# Patient Record
Sex: Female | Born: 1990
Health system: Southern US, Community
[De-identification: ages and names within clinical notes are randomized; demographics above are authoritative.]

## PROBLEM LIST (undated history)

## (undated) DIAGNOSIS — F419 Anxiety disorder, unspecified: Secondary | ICD-10-CM

## (undated) HISTORY — PX: WISDOM TOOTH EXTRACTION: SHX21

## (undated) HISTORY — PX: FRACTURE SURGERY: SHX138

## (undated) HISTORY — PX: TONSILLECTOMY: SUR1361

## (undated) HISTORY — PX: APPENDECTOMY: SHX54

## (undated) HISTORY — PX: ECTOPIC PREGNANCY SURGERY: SHX613

---
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
## 2010-04-15 ENCOUNTER — Ambulatory Visit: Payer: Self-pay | Admitting: Family Medicine

## 2010-04-15 ENCOUNTER — Inpatient Hospital Stay (HOSPITAL_COMMUNITY): Admission: AD | Admit: 2010-04-15 | Discharge: 2010-04-15 | Payer: Self-pay | Admitting: Family Medicine

## 2010-04-15 ENCOUNTER — Encounter: Payer: Self-pay | Admitting: Emergency Medicine

## 2010-04-24 ENCOUNTER — Ambulatory Visit (HOSPITAL_COMMUNITY): Admission: AD | Admit: 2010-04-24 | Discharge: 2010-04-24 | Payer: Self-pay | Admitting: Obstetrics and Gynecology

## 2010-09-18 LAB — CROSSMATCH
ABO/RH(D): A POS
Unit division: 0

## 2010-09-18 LAB — CBC
HCT: 35.4 % — ABNORMAL LOW (ref 36.0–46.0)
RBC: 4.11 MIL/uL (ref 3.87–5.11)
RDW: 12.8 % (ref 11.5–15.5)
WBC: 11.8 10*3/uL — ABNORMAL HIGH (ref 4.0–10.5)

## 2010-09-18 LAB — COMPREHENSIVE METABOLIC PANEL
AST: 18 U/L (ref 0–37)
Albumin: 3.6 g/dL (ref 3.5–5.2)
Alkaline Phosphatase: 60 U/L (ref 39–117)
BUN: 7 mg/dL (ref 6–23)
CO2: 23 mEq/L (ref 19–32)
Chloride: 109 mEq/L (ref 96–112)
GFR calc non Af Amer: >60 mL/min (ref >60)
Potassium: 3.5 mEq/L (ref 3.5–5.1)
Total Bilirubin: 0.4 mg/dL (ref 0.3–1.2)

## 2010-09-18 LAB — ABO/RH: ABO/RH(D): A POS

## 2010-09-19 LAB — CBC
MCH: 29 pg (ref 26.0–34.0)
MCV: 84.9 fL (ref 78.0–100.0)
Platelets: 234 10*3/uL (ref 150–400)
RDW: 12.8 % (ref 11.5–15.5)

## 2010-09-19 LAB — DIFFERENTIAL
Basophils Absolute: 0.1 10*3/uL (ref 0.0–0.1)
Eosinophils Absolute: 0 10*3/uL (ref 0.0–0.7)
Eosinophils Relative: 0 % (ref 0–5)
Lymphs Abs: 1.7 10*3/uL (ref 0.7–4.0)

## 2010-09-19 LAB — COMPREHENSIVE METABOLIC PANEL
Alkaline Phosphatase: 63 U/L (ref 39–117)
BUN: 6 mg/dL (ref 6–23)
GFR calc non Af Amer: >60 mL/min (ref >60)
Glucose, Bld: 95 mg/dL (ref 70–99)
Potassium: 3.8 mEq/L (ref 3.5–5.1)
Total Protein: 7 g/dL (ref 6.0–8.3)

## 2010-09-19 LAB — WET PREP, GENITAL: Clue Cells Wet Prep HPF POC: NONE SEEN

## 2010-09-19 LAB — URINALYSIS, ROUTINE W REFLEX MICROSCOPIC
Leukocytes, UA: NEGATIVE
Nitrite: NEGATIVE
Specific Gravity, Urine: 1.025 (ref 1.005–1.030)
Urobilinogen, UA: 0.2 mg/dL (ref 0.0–1.0)

## 2010-09-19 LAB — URINE MICROSCOPIC-ADD ON

## 2010-09-19 LAB — GC/CHLAMYDIA PROBE AMP, GENITAL
Chlamydia, DNA Probe: NEGATIVE
GC Probe Amp, Genital: NEGATIVE

## 2010-09-19 LAB — PREGNANCY, URINE: Preg Test, Ur: POSITIVE

## 2010-09-19 LAB — HCG, QUANTITATIVE, PREGNANCY: hCG, Beta Chain, Quant, S: 3886 m[IU]/mL — ABNORMAL HIGH (ref <5)

## 2010-11-19 NOTE — Op Note (Signed)
NAME:  Telleria, Caterin                ACCOUNT NO.:  [id number]   MEDICAL RECORD NO.:  [id number]          PATIENT TYPE:  OBV   LOCATION:  6119                         FACILITY:  [facility]   PHYSICIAN:  Antony Contras, MD     DATE OF BIRTH:  [birth year]   DATE OF PROCEDURE:  05/20/2007  DATE OF DISCHARGE:                               OPERATIVE REPORT   PREOPERATIVE DIAGNOSIS:  Left peritonsillar abscess.   POSTOPERATIVE DIAGNOSIS:  Left peritonsillar abscess.   PROCEDURE:  Incision and drainage of left peritonsillar abscess.   SURGEON:  Antony Contras, M.D.   ANESTHESIA:  Local.   COMPLICATIONS:  None.   INDICATIONS:  The patient is a 20 year old [place] female who has had left  sided throat pain for about five days that has been worsening.  A CT  scan has demonstrated a left peritonsillar abscess.   DESCRIPTION OF PROCEDURE:  The patient is identified in the hospital  room.  Informed consent was obtained from the family including a  discussion of risks, benefits, and alternatives.  The patient was placed  in a sitting position and the left oropharynx was sprayed with  Hurricaine spray on two occasions.  The left oropharynx was then  injected with 1% lidocaine with 1:100,000 epinephrine.  A horizontal  incision was made with an 11 blade scalpel above the left tonsil.  The  deep tissues were then dissected bluntly using a curved hemostat until  the abscess was entered and yellow pus drained.  The opening was widened  somewhat with a hemostat.  The patient tolerated the procedure well  without complications.      Antony Contras, MD  Electronically Signed     DDB/MEDQ  D:  05/20/2007  T:  05/20/2007  Job:  [id number]

## 2010-11-19 NOTE — H&P (Signed)
NAME:  [name] [name], [name] [name]                ACCOUNT NO.:  [id number]   MEDICAL RECORD NO.:  [id number]          PATIENT TYPE:  OBV   LOCATION:  6119                         FACILITY:  [facility]   PHYSICIAN:  Antony Contras, MD     DATE OF BIRTH:  11-11-1990   DATE OF ADMISSION:  05/19/2007  DATE OF DISCHARGE:  05/20/2007                              HISTORY & PHYSICAL   CHIEF COMPLAINT:  Throat pain.   HISTORY OF THE PRESENT ILLNESS:  The patient is a 20 year old [name] [name]  female who developed a sore throat on the left side about 5 days ago.  She went to her doctor where Strept and mono testing were negative.  She  was prescribed a Z-Pak.  The pain worsened and she returned her doctor  where her antibiotic was changed to Augmentin.  She was also given a  steroid shot and pain medication.  The pain continued worsen through the  day yesterday and she came to the emergency department as a result.  She  is having difficulty swallowing due to pain.  She was diagnosed with a  left peritonsillar abscess and is admitted into the hospital.  Last  night her pain was 9/10 and today it is 2/10.  The pain radiates into  the left ear.  She has no other complaints.   PAST MEDICAL HISTORY:  None.   PAST SURGICAL HISTORY:  Appendectomy and two ankle surgeries   MEDICATIONS:  YAZ, Celexa and Augmentin.   ALLERGIES:  No known drug allergies.   FAMILY HISTORY:  The family history is positive for heart disease in her  grandparents.   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  She  admits to drinking and smoking cigarettes socially.   REVIEW OF SYSTEMS:  The review of systems is negative, except as listed  above.   PHYSICAL EXAMINATION:  VITAL SIGNS:  On physical exam she is afebrile nd  the vital signs are stable.  GENERAL APPEARANCE:  In general the patient is in no acute distress.  She is pleasant and cooperative.  She appears slightly uncomfortable;  and, is accompanied by her  parents.  EARS:  The external ears are normal.  External canals are patent.  Tympanic membranes are intact.  Middle ear spaces are aerated.  NOSE:  The external nose is normal.  Nasal passages are patent.  Septum  is relatively midline.  MOUTH:  The lips, teeth and gums are normal.  The patient has slight  trismus.  The tongue and the floor of the mouth are normal.  The left  tonsil is inflamed with exudate and with some fullness at the left  peritonsillar region.  The uvula is relatively midline.  The right  tonsil is normal in appearance.  HEAD AND FACE:  The head and face reveal there are no abnormalities.  NECK:  In the neck the patient has some tenderness in the left zone two  region; otherwise, the neck is without tenderness and without mass.  LYMPHATICS:  The lymphatics show there is no cervical adenopathy.  NEUROLOGIC:  The neurological exam show cranial nerves II-XII grossly  intact.  THYROID:  The thyroid is normal.   ANCILLARY DATA:  Radiologic exam:  A CT scan of the neck with contrast  that was performed last night was personally reviewed and demonstrates a  fluid collection in the left peritonsillar region measuring about 1.6  cm.   ASSESSMENT:  The patient is a 20 year old [name] [name] female with a left  peritonsillar abscess.   PLAN:  Incision and drainage will be performed at the bedside under a  local anesthetic.  The risks, benefits and alternatives were discussed.  After treatment the patient we will be able to be discharged from the  hospital to continue on Augmentin.  Follow up be arranged in 1 week.      Antony Contras, MD  Electronically Signed     DDB/MEDQ  D:  05/20/2007  T:  05/21/2007  Job:  [id number]

## 2010-11-22 NOTE — Op Note (Signed)
NAME:  [name] [name], [name] [name]                ACCOUNT NO.:  [id number]   MEDICAL RECORD NO.:  [id number]          PATIENT TYPE:  INP   LOCATION:  6120                         FACILITY:  [facility]   PHYSICIAN:  Prabhakar D. Pendse, M.D.DATE OF BIRTH:  March 12, 1991   DATE OF PROCEDURE:  11/25/2004  DATE OF DISCHARGE:                                 OPERATIVE REPORT   PREOPERATIVE DIAGNOSIS:  Acute appendicitis.   POSTOPERATIVE DIAGNOSIS:  Acute retrocecal appendicitis without perforation.   OPERATION PERFORMED:  Exploratory laparotomy and appendectomy.   SURGEON:  Dr. Levie Heritage.   ASSISTANT:  Nurse.   ANESTHESIA:  Nurse.   OPERATIVE PROCEDURE:  Under satisfactory general anesthesia, the patient in  the supine position, abdomen thoroughly prepped and draped in the usual  manner. About a 2 inch long transverse incision was made in the right lower  quadrant area.  The skin and subcutaneous were tissue incised. There was  about a 4 cm-5 cm thick layer of fatty tissue from the muscles incised in  the McBurney fashion and the peritoneal cavity was entered. Exploration  revealed the appendix to be in the retrocecal position with evidence of  congestion, edema, and serositis without any evidence of gross perforation.  Examination of the distal limb showed no evidence of ileitis. Further  exploration was deferred. The appendix was now gradually separated from the  retrocecal position. Some short mesentery was serially clamped, cut, and  ligated with 2-0 silk.  Appendiceal base was exposed. Appendectomy was done  in the routine fashion. The stump was buried in the cecal wall with 3-0 silk  purse-string suture. Hemostasis accomplished. The bowel was returned to the  peritoneal cavity. A limited irrigation was carried out.  The returns were  clear. Sponge and needle count being correct, peritoneum was closed with 2-0  Vicryl running, interlocking sutures. The wound was irrigated. Muscles  approximated with 2-0  Vicryl interrupted sutures, and the subcutaneous  tissue with 2-  0 Vicryl interrupted sutures. Skin was closed with 4-0 Monocryl subcuticular  sutures. Steri-Strips were applied. Appropriate dressing was applied.  Throughout the procedure, the patient's vital signs remained stable. The  patient withstood the procedure well and was transferred to recovery room in  satisfactory general condition.      PDP/MEDQ  D:  11/25/2004  T:  11/25/2004  Job:  [id number]   cc:   Teena Irani. Arlyce Dice, M.D.  [address]  [place]  [place] [address]  Fax: [phone]   Bethann Berkshire, MD  [address] [place], [place] [address]

## 2010-11-22 NOTE — Discharge Summary (Signed)
NAME:  [name] [name], [name] [name]                ACCOUNT NO.:  [id number]   MEDICAL RECORD NO.:  [id number]          PATIENT TYPE:  INP   LOCATION:  6120                         FACILITY:  [facility]   PHYSICIAN:  Pediatrics Resident    DATE OF BIRTH:  June 05, 1991   DATE OF ADMISSION:  11/25/2004  DATE OF DISCHARGE:                                 DISCHARGE SUMMARY   There was no dictation for this job.      PR/MEDQ  D:  11/26/2004  T:  11/26/2004  Job:  [id number]

## 2010-11-22 NOTE — Discharge Summary (Signed)
NAME:  [name] [name], [name] [name]                ACCOUNT NO.:  [id number]   MEDICAL RECORD NO.:  [id number]          PATIENT TYPE:  OBV   LOCATION:  6119                         FACILITY:  [facility]   PHYSICIAN:  Antony Contras, MD     DATE OF BIRTH:  Nov 28, 1990   DATE OF ADMISSION:  05/20/2007  DATE OF DISCHARGE:  05/20/2007                               DISCHARGE SUMMARY   ADMISSION DIAGNOSES:  1. Throat pain.  2. Left peritonsillar abscess.   DISCHARGE DIAGNOSIS:  1. Throat pain.  2. Left peritonsillar abscess.   PROCEDURES:  Incision and drainage of left peritonsillar abscess.   HISTORY OF PRESENT ILLNESS:  The patient is a 20 year old female who has  had a sore throat since Sunday.  She was treated with a Z-Pak, but pain  worsened.  She was then changed to Augmentin and given a steroid shot.  The pain worsened through the day yesterday, and she came to the  emergency department.  She was admitted to hospital after a CT  demonstrated 1.6 cm left peritonsillar abscess.   HOSPITAL COURSE:  The patient was admitted to the hospital for IV  antibiotics and IV hydration.  While in the hospital, an incision and  drainage was performed at bedside at the left peritonsillar abscess.  For details of that procedure, please see dictated operative note.  After this was completed, the patient was discharged home.   DISCHARGE INSTRUCTIONS:  The patient was asked to resume a regular diet,  increase activity slowly.   DISCHARGE MEDICATIONS:  Augmentin 875 mg twice daily.   FOLLOWUP:  The patient will follow up with Dr. Jenne Pane in 1 week.      Antony Contras, MD  Electronically Signed     DDB/MEDQ  D:  06/15/2007  T:  06/16/2007  Job:  [id number]   cc:   Antony Contras, MD  Chart

## 2010-11-22 NOTE — Discharge Summary (Signed)
NAME:  [name] [name]                ACCOUNT NO.:  [id number]   MEDICAL RECORD NO.:  [id number]          PATIENT TYPE:  INP   LOCATION:  6120                         FACILITY:  [facility]   PHYSICIAN:  Prabhakar D. Pendse, M.D.DATE OF BIRTH:  [birth year]   DATE OF ADMISSION:  11/25/2004  DATE OF DISCHARGE:  11/26/2004                                 DISCHARGE SUMMARY   HISTORY OF PRESENT ILLNESS:  The patient is a 20 year old [name] [name] female who  was admitted with right lower quadrant pain.  She was seen at [facility] initially for the right lower quadrant abdominal pain, vomiting,  diarrhea and anorexia.  A CT scan obtained at [facility] shows  appendicitis with appendix in the retrocecal position.  She was referred to  [facility].   HOSPITAL COURSE:  She underwent an open appendectomy on Nov 25, 2004, by Dr.  Hyman Bible. Pendse.  The patient received two doses of Unasyn  postoperatively.  By the time of discharge the patient was tolerating a full  liquid diet without difficulty.  She remained afebrile throughout the course  of hospitalization.   OPERATION/PROCEDURE:  1.  A CT of the abdomen and pelvis obtained on Nov 25, 2004, showing      appendicitis with the appendix in the retrocecal position.  2.  The patient underwent an open appendectomy by Dr. Levie [name] on Nov 25, 2004.   DISCHARGE DIAGNOSIS:  Appendicitis, status post open appendectomy on Nov 25, 2004.   DISCHARGE MEDICATIONS:  Tylenol #3, one to two tab p.o. q.4h. p.r.n. pain.   CONDITION ON DISCHARGE:  Improved.   DISCHARGE INSTRUCTIONS/FOLLOWUP:  1.  The patient is instructed to follow up with Dr. Levie [name] on December 11, 2004,      on Wednesday at 4:15 p.m.  2.  She is instructed no PE for two weeks.  3.  She may return to school in three days.  4.  She is instructed to take a soft diet until she has two bowel movements,      and then advance to a regular diet.  5.  She is also  instructed to keep the wound dry for the next seven days,      and then bathe normally thereafter.      WTP/MEDQ  D:  11/26/2004  T:  11/26/2004  Job:  [id number]

## 2011-04-15 LAB — DIFFERENTIAL
Basophils Relative: 1
Eosinophils Absolute: 0.1 — ABNORMAL LOW
Eosinophils Relative: 1
Monocytes Absolute: 0.6
Monocytes Relative: 6

## 2011-04-15 LAB — CBC
HCT: 37.3
Hemoglobin: 12.7
MCHC: 34.1
MCV: 81.6
RBC: 4.57

## 2012-06-26 ENCOUNTER — Encounter (HOSPITAL_COMMUNITY): Payer: Self-pay

## 2012-06-26 ENCOUNTER — Emergency Department (HOSPITAL_COMMUNITY)
Admission: EM | Admit: 2012-06-26 | Discharge: 2012-06-26 | Disposition: A | Discharge status: Home / Self Care | Payer: Managed Care, Other (non HMO) | Attending: Emergency Medicine | Admitting: Emergency Medicine

## 2012-06-26 DIAGNOSIS — F419 Anxiety disorder, unspecified: Secondary | ICD-10-CM

## 2012-06-26 DIAGNOSIS — F17200 Nicotine dependence, unspecified, uncomplicated: Secondary | ICD-10-CM | POA: Insufficient documentation

## 2012-06-26 DIAGNOSIS — Z79899 Other long term (current) drug therapy: Secondary | ICD-10-CM | POA: Insufficient documentation

## 2012-06-26 DIAGNOSIS — F411 Generalized anxiety disorder: Secondary | ICD-10-CM | POA: Insufficient documentation

## 2012-06-26 DIAGNOSIS — R21 Rash and other nonspecific skin eruption: Secondary | ICD-10-CM | POA: Insufficient documentation

## 2012-06-26 HISTORY — DX: Anxiety disorder, unspecified: F41.9

## 2012-06-26 MED ORDER — LORAZEPAM 1 MG PO TABS
1.0000 mg | ORAL_TABLET | Freq: Once | ORAL | Status: AC
  Administered 2012-06-26: 1 mg via ORAL
  Filled 2012-06-26: qty 1

## 2012-06-26 MED ORDER — DIPHENHYDRAMINE HCL 25 MG PO TABS: 25.0000 mg | ORAL_TABLET | Freq: Four times a day (QID) | ORAL | Status: DC

## 2012-06-26 MED ORDER — DIPHENHYDRAMINE HCL 25 MG PO CAPS
25.0000 mg | ORAL_CAPSULE | Freq: Once | ORAL | Status: AC
  Administered 2012-06-26: 25 mg via ORAL
  Filled 2012-06-26: qty 1

## 2012-06-26 MED ORDER — PREDNISONE 50 MG PO TABS: ORAL_TABLET | ORAL | Status: DC

## 2012-06-26 NOTE — ED Provider Notes (Signed)
History   This chart was scribed for [name] Octave, MD by Leone Payor, ED Scribe. This patient was seen in room APA11/APA11 and the patient's care was started at 1533.   CSN: [id number]  Arrival date & time 06/26/12  1516   First MD Initiated Contact with Patient 06/26/12 1533        Chief Complaint  Patient presents with  . Anxiety    The history is provided by the patient. No language interpreter was used.    [name] [name] is a 21 y.o. female who presents to the Emergency Department complaining of a recurring rash to the chest and upper back starting 1 day ago. Pt had a previous episode 2 weeks ago and was given 6 days worth of steroids with relief. She states that this is the second break out in past month. Pt denies use of any new soaps or lotions. Pt states she took benedryl yesterday with mild relief. Pt denies any rashes in the mouth or genitals. She states she and her boyfriend are not getting along and this is a stressful time of the year. Pt has associated itching and anxiety but denies any chest pain, cough, stomach pain, vomiting.   Pt has h/o anxiety.  Pt is a current everyday smoker but denies alcohol use. Past Medical History  Diagnosis Date  . Anxiety     Past Surgical History  Procedure Date  . Appendectomy   . Fracture surgery   . Tonsillectomy   . Ectopic pregnancy surgery     No family history on file.  History  Substance Use Topics  . Smoking status: Current Every Day Smoker  . Smokeless tobacco: Not on file  . Alcohol Use: No    OB History    Grav Para Term Preterm Abortions TAB SAB Ect Mult Living                  Review of Systems  A complete 10 system review of systems was obtained and all systems are negative except as noted in the HPI and PMH.    Allergies  Review of patient's allergies indicates no known allergies.  Home Medications   Current Outpatient Rx  Name  Route  Sig  Dispense  Refill  . DIPHENHYDRAMINE HCL 25 MG PO  TABS   Oral   Take 1 tablet (25 mg total) by mouth every 6 (six) hours.   20 tablet   0   . PREDNISONE 50 MG PO TABS      1 tablet PO daily   5 tablet   0     BP 108/77  Pulse 96  Temp 98.9 F (37.2 C) (Oral)  Resp 16  Ht 5\' 5"  (1.651 m)  Wt 195 lb (88.451 kg)  BMI 32.45 kg/m2  SpO2 100%  LMP 05/11/2012  Physical Exam  Nursing note and vitals reviewed. Constitutional: She appears well-developed and well-nourished.       Appears anxious.   HENT:  Head: Normocephalic and atraumatic.       Oropharynx is clear. Uvula is midline. No asymmetry.   Eyes: Conjunctivae normal are normal. Pupils are equal, round, and reactive to light.  Neck: Neck supple. No tracheal deviation present. No thyromegaly present.  Cardiovascular: Normal rate and regular rhythm.   No murmur heard. Pulmonary/Chest: Effort normal and breath sounds normal.       Lungs are clear.   Abdominal: Soft. Bowel sounds are normal. She exhibits no distension. There is no  tenderness.  Musculoskeletal: Normal range of motion. She exhibits no edema and no tenderness.  Neurological: She is alert. Coordination normal.  Skin: Skin is warm and dry. No rash noted.       Maculopapular rash to chest and upper back   Psychiatric: She has a normal mood and affect.    ED Course  Procedures (including critical care time)  DIAGNOSTIC STUDIES: Oxygen Saturation is 98% on room air, normal by my interpretation.    COORDINATION OF CARE:  3:48 PM Discussed treatment plan which includes benedryl with pt at bedside and pt agreed to plan.    Labs Reviewed - No data to display No results found.   1. Anxiety   2. Rash       MDM  Patient complains of itchy rash to chest and upper back she feels is due to anxiety and stress with her boyfriend. Denies any difficulty breathing or swallowing. No new exposures. No rash in mouth or genitals.   Lungs clear.  Given PO ativan and benadryl.  Improvement in HR to 90s.  No  CP or SOB. Suspect rash related to anxiety.   Date: 06/26/2012  Rate: 101  Rhythm: sinus tachycardia  QRS Axis: normal  Intervals: normal  ST/T Wave abnormalities: normal  Conduction Disutrbances:none  Narrative Interpretation:   Old EKG Reviewed: none available       I personally performed the services described in this documentation, which was scribed in my presence. The recorded information has been reviewed and is accurate.    [name] Octave, MD 06/26/12 [id number]

## 2012-06-26 NOTE — ED Notes (Signed)
Pt states she and her boyfriend are not getting along and it is a stressful time of the year. States she is real anxious and is broke out in a rash.

## 2013-06-03 ENCOUNTER — Emergency Department (HOSPITAL_BASED_OUTPATIENT_CLINIC_OR_DEPARTMENT_OTHER)
Admission: EM | Admit: 2013-06-03 | Discharge: 2013-06-03 | Disposition: A | Discharge status: Home / Self Care | Payer: 59 | Attending: Emergency Medicine | Admitting: Emergency Medicine

## 2013-06-03 ENCOUNTER — Encounter (HOSPITAL_BASED_OUTPATIENT_CLINIC_OR_DEPARTMENT_OTHER): Payer: Self-pay | Admitting: Emergency Medicine

## 2013-06-03 DIAGNOSIS — F17200 Nicotine dependence, unspecified, uncomplicated: Secondary | ICD-10-CM | POA: Insufficient documentation

## 2013-06-03 DIAGNOSIS — IMO0002 Reserved for concepts with insufficient information to code with codable children: Secondary | ICD-10-CM | POA: Insufficient documentation

## 2013-06-03 DIAGNOSIS — L309 Dermatitis, unspecified: Secondary | ICD-10-CM

## 2013-06-03 DIAGNOSIS — L02219 Cutaneous abscess of trunk, unspecified: Secondary | ICD-10-CM | POA: Insufficient documentation

## 2013-06-03 DIAGNOSIS — L089 Local infection of the skin and subcutaneous tissue, unspecified: Secondary | ICD-10-CM | POA: Insufficient documentation

## 2013-06-03 DIAGNOSIS — R059 Cough, unspecified: Secondary | ICD-10-CM | POA: Insufficient documentation

## 2013-06-03 DIAGNOSIS — R05 Cough: Secondary | ICD-10-CM | POA: Insufficient documentation

## 2013-06-03 DIAGNOSIS — J029 Acute pharyngitis, unspecified: Secondary | ICD-10-CM | POA: Insufficient documentation

## 2013-06-03 DIAGNOSIS — F411 Generalized anxiety disorder: Secondary | ICD-10-CM | POA: Insufficient documentation

## 2013-06-03 DIAGNOSIS — L259 Unspecified contact dermatitis, unspecified cause: Secondary | ICD-10-CM | POA: Insufficient documentation

## 2013-06-03 MED ORDER — SULFAMETHOXAZOLE-TRIMETHOPRIM 800-160 MG PO TABS: 1.0000 | ORAL_TABLET | Freq: Two times a day (BID) | ORAL | Status: DC

## 2013-06-03 MED ORDER — HYDROXYZINE HCL 25 MG PO TABS: ORAL_TABLET | ORAL | Status: DC

## 2013-06-03 MED ORDER — TRIAMCINOLONE ACETONIDE 0.1 % EX CREA: TOPICAL_CREAM | CUTANEOUS | Status: DC

## 2013-06-03 NOTE — ED Provider Notes (Signed)
  Medical screening examination/treatment/procedure(s) were performed by non-physician practitioner and as supervising physician I was immediately available for consultation/collaboration.     Gerhard Munch, MD 06/03/13 1452

## 2013-06-03 NOTE — ED Provider Notes (Signed)
CSN: [id number]     Arrival date & time 06/03/13  1245 History   First MD Initiated Contact with Patient 06/03/13 1307     Chief Complaint  Patient presents with  . Cellulitis  . Anxiety   (Consider location/radiation/quality/duration/timing/severity/associated sxs/prior Treatment) Patient is a 22 y.o. female presenting with rash. The history is provided by the patient.  Rash Quality: painful and redness   Pain details:    Onset quality:  Gradual   Duration:  4 days   Timing:  Constant   Progression:  Worsening Severity:  Moderate Chronicity:  New Associated symptoms: sore throat   Associated symptoms: no abdominal pain, no fever, no headaches, no nausea and not vomiting    [name] [name] is a 22 y.o. female who presents to the ED with a rash. About 4 days ago she noticed a red raised area on her right forearm and abdomen. Since then the areas have gotten large and have a head. There is redness noted. She also has a rash to her left wrist and right hand that she has off and on for 3 years. Areas she thinks is eczema and her doctor has given her Aristocort cream.  At first she thought the rash on her abdomen and arm were due to stress and it may be shingles, but no other places have come up and now these two look infected.  Past Medical History  Diagnosis Date  . Anxiety    Past Surgical History  Procedure Laterality Date  . Appendectomy    . Fracture surgery    . Tonsillectomy    . Ectopic pregnancy surgery     No family history on file. History  Substance Use Topics  . Smoking status: Current Every Day Smoker  . Smokeless tobacco: Not on file  . Alcohol Use: No   OB History   Grav Para Term Preterm Abortions TAB SAB Ect Mult Living                 Review of Systems  Constitutional: Negative for fever, chills and appetite change.  HENT: Positive for congestion and sore throat. Negative for trouble swallowing.   Eyes: Negative for visual disturbance.  Respiratory:  Positive for cough.   Cardiovascular: Negative for chest pain.  Gastrointestinal: Negative for nausea, vomiting and abdominal pain.  Genitourinary: Negative for dysuria, urgency and frequency.  Musculoskeletal: Negative for neck stiffness.  Skin: Positive for rash.  Neurological: Negative for syncope and headaches.  Psychiatric/Behavioral: The patient is nervous/anxious (anxiety).     Allergies  Review of patient's allergies indicates no known allergies.  Home Medications  No current outpatient prescriptions on file. BP 134/79  Pulse 84  Temp(Src) 97.9 F (36.6 C) (Oral)  Resp 16  Ht 5\' 5"  (1.651 m)  Wt 200 lb (90.719 kg)  BMI 33.28 kg/m2  SpO2 100%  LMP 03/14/2013 Physical Exam  Nursing note and vitals reviewed. Constitutional: She is oriented to person, place, and time. She appears well-developed and well-nourished.  HENT:  Head: Normocephalic and atraumatic.  Right Ear: Tympanic membrane normal.  Left Ear: Tympanic membrane normal.  Nose: Nose normal.  Mouth/Throat: Uvula is midline, oropharynx is clear and moist and mucous membranes are normal.  Eyes: Conjunctivae and EOM are normal. Pupils are equal, round, and reactive to light.  Neck: Neck supple.  Cardiovascular: Normal rate, regular rhythm and normal heart sounds.   Pulmonary/Chest: Effort normal and breath sounds normal.  Abdominal: Soft. There is no tenderness.  Musculoskeletal: Normal range of motion.       Arms: 1 cm firm area with Slay center noted right forearm palmar aspect. Tender on palpation. Rash to right wrist and fingers and left wrist consistent with eczema.  Neurological: She is alert and oriented to person, place, and time. She has normal strength. No cranial nerve deficit or sensory deficit. Gait normal.  Skin: Skin is warm and dry.  Psychiatric: She has a normal mood and affect. Her behavior is normal.    ED Course  Procedures   MDM  22 y.o. female with small abscess right forearm and  abdomen with area of cellulitis. Rash bilateral hands and wrist consistent with eczema. Will treat with Atarax, bactrim and Kenalog. Discussed with the patient and all questioned fully answered. She will return if any problems arise.    Medication List         hydrOXYzine 25 MG tablet  Commonly known as:  ATARAX/VISTARIL  Take one tablet PO every 6 to 8 hours as needed.     sulfamethoxazole-trimethoprim 800-160 MG per tablet  Commonly known as:  SEPTRA DS  Take 1 tablet by mouth every 12 (twelve) hours.     triamcinolone cream 0.1 %  Commonly known as:  KENALOG  Apply to areas of eczema two times daily           [facility], NP 06/03/13 1348

## 2013-06-03 NOTE — ED Notes (Signed)
Pt has infected areas on skin x 3 days.  Pus filled centers, redness around perimeter.  No fever. Pt states she has hives due to anxiety.

## 2013-06-03 NOTE — ED Notes (Signed)
NP at bedside.

## 2014-11-05 ENCOUNTER — Inpatient Hospital Stay (HOSPITAL_COMMUNITY): Payer: Self-pay

## 2014-11-05 ENCOUNTER — Inpatient Hospital Stay (HOSPITAL_COMMUNITY)
Admission: AD | Admit: 2014-11-05 | Discharge: 2014-11-05 | Disposition: A | Discharge status: Home / Self Care | Payer: Self-pay | Source: Ambulatory Visit | Attending: Obstetrics and Gynecology | Admitting: Obstetrics and Gynecology

## 2014-11-05 ENCOUNTER — Encounter (HOSPITAL_COMMUNITY): Payer: Self-pay | Admitting: *Deleted

## 2014-11-05 DIAGNOSIS — Z8759 Personal history of other complications of pregnancy, childbirth and the puerperium: Secondary | ICD-10-CM

## 2014-11-05 DIAGNOSIS — O99331 Smoking (tobacco) complicating pregnancy, first trimester: Secondary | ICD-10-CM | POA: Insufficient documentation

## 2014-11-05 DIAGNOSIS — Z3A01 Less than 8 weeks gestation of pregnancy: Secondary | ICD-10-CM | POA: Insufficient documentation

## 2014-11-05 DIAGNOSIS — O9989 Other specified diseases and conditions complicating pregnancy, childbirth and the puerperium: Secondary | ICD-10-CM | POA: Insufficient documentation

## 2014-11-05 DIAGNOSIS — O3680X0 Pregnancy with inconclusive fetal viability, not applicable or unspecified: Secondary | ICD-10-CM

## 2014-11-05 DIAGNOSIS — R109 Unspecified abdominal pain: Secondary | ICD-10-CM | POA: Insufficient documentation

## 2014-11-05 DIAGNOSIS — O26899 Other specified pregnancy related conditions, unspecified trimester: Secondary | ICD-10-CM

## 2014-11-05 LAB — WET PREP, GENITAL
CLUE CELLS WET PREP: NONE SEEN
Trich, Wet Prep: NONE SEEN
YEAST WET PREP: NONE SEEN

## 2014-11-05 LAB — CBC
HCT: 36.7 % (ref 36.0–46.0)
Hemoglobin: 12.4 g/dL (ref 12.0–15.0)
MCH: 28.6 pg (ref 26.0–34.0)
MCHC: 33.8 g/dL (ref 30.0–36.0)
MCV: 84.8 fL (ref 78.0–100.0)
PLATELETS: 270 10*3/uL (ref 150–400)
RBC: 4.33 MIL/uL (ref 3.87–5.11)
RDW: 12.3 % (ref 11.5–15.5)
WBC: 9.7 10*3/uL (ref 4.0–10.5)

## 2014-11-05 LAB — URINALYSIS, ROUTINE W REFLEX MICROSCOPIC
BILIRUBIN URINE: NEGATIVE
Glucose, UA: NEGATIVE mg/dL
HGB URINE DIPSTICK: NEGATIVE
Ketones, ur: NEGATIVE mg/dL
Leukocytes, UA: NEGATIVE
Nitrite: NEGATIVE
PROTEIN: NEGATIVE mg/dL
Specific Gravity, Urine: 1.02 (ref 1.005–1.030)
UROBILINOGEN UA: 2 mg/dL — AB (ref 0.0–1.0)
pH: 6.5 (ref 5.0–8.0)

## 2014-11-05 LAB — HCG, QUANTITATIVE, PREGNANCY: hCG, Beta Chain, Quant, S: 681 m[IU]/mL — ABNORMAL HIGH (ref <5)

## 2014-11-05 LAB — POCT PREGNANCY, URINE: PREG TEST UR: POSITIVE — AB

## 2014-11-05 NOTE — MAU Note (Signed)
Pt states here for +upt. LMP-09/12/2014. Having mild lower abd pain in middle of abdomen. No vag d/c issues. Pt has hx ectopic pregnancy 4.5 years ago.

## 2014-11-05 NOTE — Discharge Instructions (Signed)
Abdominal Pain During Pregnancy °Abdominal pain is common in pregnancy. Most of the time, it does not cause harm. There are many causes of abdominal pain. Some causes are more serious than others. Some of the causes of abdominal pain in pregnancy are easily diagnosed. Occasionally, the diagnosis takes time to understand. Other times, the cause is not determined. Abdominal pain can be a sign that something is very wrong with the pregnancy, or the pain may have nothing to do with the pregnancy at all. For this reason, always tell your health care provider if you have any abdominal discomfort. °HOME CARE INSTRUCTIONS  °Monitor your abdominal pain for any changes. The following actions may help to alleviate any discomfort you are experiencing: °· Do not have sexual intercourse or put anything in your vagina until your symptoms go away completely. °· Get plenty of rest until your pain improves. °· Drink clear fluids if you feel nauseous. Avoid solid food as long as you are uncomfortable or nauseous. °· Only take over-the-counter or prescription medicine as directed by your health care provider. °· Keep all follow-up appointments with your health care provider. °SEEK IMMEDIATE MEDICAL CARE IF: °· You are bleeding, leaking fluid, or passing tissue from the vagina. °· You have increasing pain or cramping. °· You have persistent vomiting. °· You have painful or bloody urination. °· You have a fever. °· You notice a decrease in your baby's movements. °· You have extreme weakness or feel faint. °· You have shortness of breath, with or without abdominal pain. °· You develop a severe headache with abdominal pain. °· You have abnormal vaginal discharge with abdominal pain. °· You have persistent diarrhea. °· You have abdominal pain that continues even after rest, or gets worse. °MAKE SURE YOU:  °· Understand these instructions. °· Will watch your condition. °· Will get help right away if you are not doing well or get  worse. °Document Released: 06/23/2005 Document Revised: 04/13/2013 Document Reviewed: 01/20/2013 °ExitCare® Patient Information ©2015 [employer]. This information is not intended to replace advice given to you by your health care provider. Make sure you discuss any questions you have with your health care provider. °Ectopic Pregnancy °An ectopic pregnancy is when the fertilized egg attaches (implants) outside the uterus. Most ectopic pregnancies occur in the fallopian tube. Rarely do ectopic pregnancies occur on the ovary, intestine, pelvis, or cervix. In an ectopic pregnancy, the fertilized egg does not have the ability to develop into a normal, healthy baby.  °A ruptured ectopic pregnancy is one in which the fallopian tube gets torn or bursts and results in internal bleeding. Often there is intense abdominal pain, and sometimes, vaginal bleeding. Having an ectopic pregnancy can be life threatening. If left untreated, this dangerous condition can lead to a blood transfusion, abdominal surgery, or even death. °CAUSES  °Damage to the fallopian tubes is the suspected cause in most ectopic pregnancies.  °RISK FACTORS °Depending on your circumstances, the risk of having an ectopic pregnancy will vary. The level of risk can be divided into three categories. °High Risk °· You have gone through infertility treatment. °· You have had a previous ectopic pregnancy. °· You have had previous tubal surgery. °· You have had previous surgery to have the fallopian tubes tied (tubal ligation). °· You have tubal problems or diseases. °· You have been exposed to DES. DES is a medicine that was used until 1971 and had effects on babies whose mothers took the medicine. °· You become pregnant while using   an intrauterine device (IUD) for birth control.  °Moderate Risk °· You have a history of infertility. °· You have a history of a sexually transmitted infection (STI). °· You have a history of pelvic inflammatory disease (PID). °· You  have scarring from endometriosis. °· You have multiple sexual partners. °· You smoke.  °Low Risk °· You have had previous pelvic surgery. °· You use vaginal douching. °· You became sexually active before 25 years of age. °SIGNS AND SYMPTOMS  °An ectopic pregnancy should be suspected in anyone who has missed a period and has abdominal pain or bleeding. °· You may experience normal pregnancy symptoms, such as: °¨ Nausea. °¨ Tiredness. °¨ Breast tenderness. °· Other symptoms may include: °¨ Pain with intercourse. °¨ Irregular vaginal bleeding or spotting. °¨ Cramping or pain on one side or in the lower abdomen. °¨ Fast heartbeat. °¨ Passing out while having a bowel movement. °· Symptoms of a ruptured ectopic pregnancy and internal bleeding may include: °¨ Sudden, severe pain in the abdomen and pelvis. °¨ Dizziness or fainting. °¨ Pain in the shoulder area. °DIAGNOSIS  °Tests that may be performed include: °· A pregnancy test. °· An ultrasound test. °· Testing the specific level of pregnancy hormone in the bloodstream. °· Taking a sample of uterus tissue (dilation and curettage, D&C). °· Surgery to perform a visual exam of the inside of the abdomen using a thin, lighted tube with a tiny camera on the end (laparoscope). °TREATMENT  °An injection of a medicine called methotrexate may be given. This medicine causes the pregnancy tissue to be absorbed. It is given if: °· The diagnosis is made early. °· The fallopian tube has not ruptured. °· You are considered to be a good candidate for the medicine. °Usually, pregnancy hormone blood levels are checked after methotrexate treatment. This is to be sure the medicine is effective. It may take 4-6 weeks for the pregnancy to be absorbed (though most pregnancies will be absorbed by 3 weeks). °Surgical treatment may be needed. A laparoscope may be used to remove the pregnancy tissue. If severe internal bleeding occurs, a cut (incision) may be made in the lower abdomen (laparotomy),  and the ectopic pregnancy is removed. This stops the bleeding. Part of the fallopian tube, or the whole tube, may be removed as well (salpingectomy). After surgery, pregnancy hormone tests may be done to be sure there is no pregnancy tissue left. You may receive a Rho (D) immune globulin shot if you are Rh negative and the father is Rh positive, or if you do not know the Rh type of the father. This is to prevent problems with any future pregnancy. °SEEK IMMEDIATE MEDICAL CARE IF:  °You have any symptoms of an ectopic pregnancy. This is a medical emergency. °MAKE SURE YOU: °· Understand these instructions. °· Will watch your condition. °· Will get help right away if you are not doing well or get worse. °Document Released: 07/31/2004 Document Revised: 11/07/2013 Document Reviewed: 01/20/2013 °ExitCare® Patient Information ©2015 [employer]. This information is not intended to replace advice given to you by your health care provider. Make sure you discuss any questions you have with your health care provider. ° °

## 2014-11-05 NOTE — MAU Provider Note (Signed)
Chief Complaint: Abdominal Pain   None     SUBJECTIVE HPI: [name] [name] is a 24 y.o. G2P0010 at [redacted]w[redacted]d by LMP who presents to maternity admissions reporting mild abdominal cramping with onset today and positive HPT ~ 1 week ago.  She has hx of ectopic pregnancy and is worried about this.  She denies vaginal bleeding, vaginal itching/burning, urinary symptoms, h/a, dizziness, n/v, or fever/chills.     Abdominal Pain This is a new problem. The current episode started today. The onset quality is gradual. The problem occurs intermittently. The most recent episode lasted 1 day. The problem has been waxing and waning. The pain is located in the LLQ, RLQ and suprapubic region. The pain is mild. The quality of the pain is cramping. The abdominal pain does not radiate. Pertinent negatives include no constipation, diarrhea, dysuria, fever, frequency, headaches, nausea or vomiting. Nothing aggravates the pain. She has tried nothing for the symptoms.    Past Medical History  Diagnosis Date  . Anxiety    Past Surgical History  Procedure Laterality Date  . Appendectomy    . Fracture surgery    . Tonsillectomy    . Ectopic pregnancy surgery     History   Social History  . Marital Status: Single    Spouse Name: N/A  . Number of Children: N/A  . Years of Education: N/A   Occupational History  . Not on file.   Social History Main Topics  . Smoking status: Current Every Day Smoker  . Smokeless tobacco: Not on file  . Alcohol Use: No  . Drug Use: No  . Sexual Activity: Yes   Other Topics Concern  . Not on file   Social History Narrative   No current facility-administered medications on file prior to encounter.   Current Outpatient Prescriptions on File Prior to Encounter  Medication Sig Dispense Refill  . hydrOXYzine (ATARAX/VISTARIL) 25 MG tablet Take one tablet PO every 6 to 8 hours as needed. (Patient not taking: Reported on 11/05/2014) 20 tablet 0  . sulfamethoxazole-trimethoprim  (SEPTRA DS) 800-160 MG per tablet Take 1 tablet by mouth every 12 (twelve) hours. 14 tablet 0  . triamcinolone cream (KENALOG) 0.1 % Apply to areas of eczema two times daily (Patient not taking: Reported on 11/05/2014) 30 g 0   No Known Allergies  Review of Systems  Constitutional: Negative for fever, chills and malaise/fatigue.  Eyes: Negative for blurred vision.  Respiratory: Negative for cough and shortness of breath.   Cardiovascular: Negative for chest pain.  Gastrointestinal: Positive for abdominal pain. Negative for heartburn, nausea, vomiting, diarrhea and constipation.  Genitourinary: Negative for dysuria, urgency and frequency.  Musculoskeletal: Negative.   Neurological: Negative for dizziness and headaches.  Psychiatric/Behavioral: Negative for depression.    OBJECTIVE Blood pressure 123/78, pulse 82, temperature 98 F (36.7 C), temperature source Oral, resp. rate 18, height  (1.651 m), weight 80.343 kg (177 lb 2 oz), last menstrual period 09/13/2014. GENERAL: Well-developed, well-nourished female in no acute distress.  EYES: normal sclera/conjunctiva; no lid-lag HENT: Atraumatic, normocephalic HEART: normal rate RESP: normal effort ABDOMEN: Soft, non-tender MUSCULOSKELETAL: Normal ROM EXTREMITIES: Nontender, no edema NEURO/PSYCH: Alert and oriented, appropriate affect  PELVIC EXAM: Cervix pink, visually closed, without lesion, scant Lanzer creamy discharge, vaginal walls and external genitalia normal Bimanual exam: Cervix 0/long/high, firm, anterior, neg CMT, uterus nontender, nonenlarged, adnexa without tenderness, enlargement, or mass   LAB RESULTS Results for orders placed or performed during the hospital encounter of 11/05/14 (from  the past 24 hour(s))  Urinalysis, Routine w reflex microscopic     Status: Abnormal   Collection Time: 11/05/14  1:37 PM  Result Value Ref Range   Color, Urine YELLOW YELLOW   APPearance CLEAR CLEAR   Specific Gravity, Urine 1.020  1.005 - 1.030   pH 6.5 5.0 - 8.0   Glucose, UA NEGATIVE NEGATIVE mg/dL   Hgb urine dipstick NEGATIVE NEGATIVE   Bilirubin Urine NEGATIVE NEGATIVE   Ketones, ur NEGATIVE NEGATIVE mg/dL   Protein, ur NEGATIVE NEGATIVE mg/dL   Urobilinogen, UA 2.0 (H) 0.0 - 1.0 mg/dL   Nitrite NEGATIVE NEGATIVE   Leukocytes, UA NEGATIVE NEGATIVE  Pregnancy, urine POC     Status: Abnormal   Collection Time: 11/05/14  1:53 PM  Result Value Ref Range   Preg Test, Ur POSITIVE (A) NEGATIVE  CBC     Status: None   Collection Time: 11/05/14  2:09 PM  Result Value Ref Range   WBC 9.7 4.0 - 10.5 K/uL   RBC 4.33 3.87 - 5.11 MIL/uL   Hemoglobin 12.4 12.0 - 15.0 g/dL   HCT 16.1 09.6 - 04.5 %   MCV 84.8 78.0 - 100.0 fL   MCH 28.6 26.0 - 34.0 pg   MCHC 33.8 30.0 - 36.0 g/dL   RDW 40.9 81.1 - 91.4 %   Platelets 270 150 - 400 K/uL  hCG, quantitative, pregnancy     Status: Abnormal   Collection Time: 11/05/14  2:09 PM  Result Value Ref Range   hCG, Beta Chain, Quant, S 681 (H) <5 mIU/mL  Wet prep, genital     Status: Abnormal   Collection Time: 11/05/14  2:30 PM  Result Value Ref Range   Yeast Wet Prep HPF POC NONE SEEN NONE SEEN   Trich, Wet Prep NONE SEEN NONE SEEN   Clue Cells Wet Prep HPF POC NONE SEEN NONE SEEN   WBC, Wet Prep HPF POC FEW (A) NONE SEEN    IMAGING US Ob Comp Less 14 Wks  11/05/2014   CLINICAL DATA:  Abdominal pain. History of previous ectopic pregnancy. Quantitative beta HCG is 681. LMP 09/13/2014. Gestational age by LMP 7 weeks 4 days. EDC by LMP 06/20/2015  EXAM: OBSTETRIC <14 WK [place] AND TRANSVAGINAL OB US  TECHNIQUE: Both transabdominal and transvaginal ultrasound examinations were performed for complete evaluation of the gestation as well as the maternal uterus, adnexal regions, and pelvic cul-de-sac. Transvaginal technique was performed to assess early pregnancy.  COMPARISON:  04/15/2010  FINDINGS: Intrauterine gestational sac: None  Yolk sac:  None  Embryo:  None  Cardiac Activity: None   Maternal uterus/adnexae: Right corpus luteum cyst is present. Left ovary has a normal appearance. No adnexal mass identified. There is trace free pelvic fluid.  IMPRESSION: 1. No intrauterine or ectopic pregnancy identified. 2. Serial quantitative beta HCG values and follow-up ultrasound are recommended as appropriate to document progression of and location of pregnancy. Ectopic pregnancy has not been excluded.   Electronically Signed   By: Norva Pavlov M.D.   On: 11/05/2014 15:54   US Ob Transvaginal  11/05/2014   CLINICAL DATA:  Abdominal pain. History of previous ectopic pregnancy. Quantitative beta HCG is 681. LMP 09/13/2014. Gestational age by LMP 7 weeks 4 days. EDC by LMP 06/20/2015  EXAM: OBSTETRIC <14 WK [place] AND TRANSVAGINAL OB US  TECHNIQUE: Both transabdominal and transvaginal ultrasound examinations were performed for complete evaluation of the gestation as well as the maternal uterus, adnexal regions, and pelvic cul-de-sac.  Transvaginal technique was performed to assess early pregnancy.  COMPARISON:  04/15/2010  FINDINGS: Intrauterine gestational sac: None  Yolk sac:  None  Embryo:  None  Cardiac Activity: None  Maternal uterus/adnexae: Right corpus luteum cyst is present. Left ovary has a normal appearance. No adnexal mass identified. There is trace free pelvic fluid.  IMPRESSION: 1. No intrauterine or ectopic pregnancy identified. 2. Serial quantitative beta HCG values and follow-up ultrasound are recommended as appropriate to document progression of and location of pregnancy. Ectopic pregnancy has not been excluded.   Electronically Signed   By: Norva PavlovElizabeth  Brown M.D.   On: 11/05/2014 15:54    ASSESSMENT 1. Pregnancy of unknown anatomic location   2. Hx of ectopic pregnancy   3. Abdominal pain affecting pregnancy     PLAN Discharge home with ectopic precautions Return to MAU in 48 hours for repeat quant hcg or sooner as needed    Medication List    STOP taking these medications         hydrOXYzine 25 MG tablet  Commonly known as:  ATARAX/VISTARIL     sulfamethoxazole-trimethoprim 800-160 MG per tablet  Commonly known as:  SEPTRA DS     triamcinolone cream 0.1 %  Commonly known as:  KENALOG       Follow-up Information    [facility].   Why:  In 48 hours for repeat labs or sooner as needed   Contact information:   [address] [id number] [place]c [place] [id number] [phone]      Sharen CounterLisa Leftwich-Kirby Certified Nurse-Midwife 11/05/2014  4:28 PM

## 2014-11-06 LAB — GC/CHLAMYDIA PROBE AMP (~~LOC~~) NOT AT ARMC
Chlamydia: NEGATIVE
Neisseria Gonorrhea: NEGATIVE

## 2014-11-06 LAB — HIV ANTIBODY (ROUTINE TESTING W REFLEX): HIV SCREEN 4TH GENERATION: NONREACTIVE

## 2014-11-07 ENCOUNTER — Inpatient Hospital Stay (HOSPITAL_COMMUNITY)
Admission: AD | Admit: 2014-11-07 | Discharge: 2014-11-07 | Disposition: A | Discharge status: Home / Self Care | Payer: 59 | Source: Ambulatory Visit | Attending: Family Medicine | Admitting: Family Medicine

## 2014-11-07 DIAGNOSIS — O26899 Other specified pregnancy related conditions, unspecified trimester: Secondary | ICD-10-CM

## 2014-11-07 DIAGNOSIS — Z3A01 Less than 8 weeks gestation of pregnancy: Secondary | ICD-10-CM | POA: Diagnosis not present

## 2014-11-07 DIAGNOSIS — Z3A08 8 weeks gestation of pregnancy: Secondary | ICD-10-CM

## 2014-11-07 DIAGNOSIS — O9989 Other specified diseases and conditions complicating pregnancy, childbirth and the puerperium: Secondary | ICD-10-CM | POA: Diagnosis not present

## 2014-11-07 DIAGNOSIS — R109 Unspecified abdominal pain: Secondary | ICD-10-CM

## 2014-11-07 LAB — HCG, QUANTITATIVE, PREGNANCY: hCG, Beta Chain, Quant, S: 2209 m[IU]/mL — ABNORMAL HIGH (ref <5)

## 2014-11-07 NOTE — MAU Provider Note (Signed)
S: 24 y.o. G2P0010 @[redacted]w[redacted]d  by LMP presents to MAU for repeat hcg.  She denies abdominal pain or vaginal bleeding today.    Her quant hcg on 11/05/14 was 681 and ultrasound showed no IUP or ectopic visible.  O: BP 121/65 mmHg  Pulse 76  Temp(Src) 98.2 F (36.8 C)  Resp 18  Ht 5\' 5"  (1.651 m)  Wt 81.647 kg (180 lb)  BMI 29.95 kg/m2  LMP 09/13/2014 (Exact Date)  Physical Examination: General appearance - alert, well appearing, and in no distress, oriented to person, place, and time and acyanotic, in no respiratory distress  Results for orders placed or performed during the hospital encounter of 11/07/14 (from the past 24 hour(s))  hCG, quantitative, pregnancy     Status: Abnormal   Collection Time: 11/07/14 12:00 PM  Result Value Ref Range   hCG, Beta Chain, Quant, S 2209 (H) <5 mIU/mL    A:  1. Abdominal pain affecting pregnancy   2.  Appropriate rise in hcg  P: D/C home with ectopic/bleeding precautions F/U with outpatient ultrasound in 10 days as ordered Return to MAU as needed for emergencies  LEFTWICH-KIRBY, Salam Micucci, CNM 2:18 PM

## 2014-11-07 NOTE — MAU Note (Signed)
For repeat BHCG. No bleeding. Mild cramping on occ

## 2014-11-07 NOTE — Discharge Instructions (Signed)

## 2014-11-09 ENCOUNTER — Inpatient Hospital Stay (HOSPITAL_COMMUNITY)
Admission: AD | Admit: 2014-11-09 | Discharge: 2014-11-09 | Disposition: A | Discharge status: Home / Self Care | Payer: 59 | Source: Ambulatory Visit | Attending: Obstetrics & Gynecology | Admitting: Obstetrics & Gynecology

## 2014-11-09 ENCOUNTER — Encounter (HOSPITAL_COMMUNITY): Payer: Self-pay | Admitting: *Deleted

## 2014-11-09 DIAGNOSIS — O209 Hemorrhage in early pregnancy, unspecified: Secondary | ICD-10-CM

## 2014-11-09 DIAGNOSIS — O200 Threatened abortion: Secondary | ICD-10-CM | POA: Diagnosis not present

## 2014-11-09 DIAGNOSIS — Z3A08 8 weeks gestation of pregnancy: Secondary | ICD-10-CM | POA: Diagnosis not present

## 2014-11-09 DIAGNOSIS — O3680X0 Pregnancy with inconclusive fetal viability, not applicable or unspecified: Secondary | ICD-10-CM

## 2014-11-09 DIAGNOSIS — Z87891 Personal history of nicotine dependence: Secondary | ICD-10-CM | POA: Insufficient documentation

## 2014-11-09 DIAGNOSIS — O4691 Antepartum hemorrhage, unspecified, first trimester: Secondary | ICD-10-CM

## 2014-11-09 DIAGNOSIS — O26851 Spotting complicating pregnancy, first trimester: Secondary | ICD-10-CM | POA: Diagnosis present

## 2014-11-09 LAB — HCG, QUANTITATIVE, PREGNANCY: HCG, BETA CHAIN, QUANT, S: 3975 m[IU]/mL — AB (ref <5)

## 2014-11-09 NOTE — MAU Provider Note (Signed)
History     CSN: [id number]  Arrival date and time: 11/09/14 1644   First Provider Initiated Contact with Patient 11/09/14 1815      Chief Complaint  Patient presents with  . Vaginal Bleeding  . Abdominal Pain   HPI [name] [name] 24 y.o. G2P0010 @ 5327w1d presents to MAU complaining of vaginal spotting.     Earlier this afternoon, she noticed some dark brown blood upon wiping after having a hard stool bowel movement.  She cannot say if the blood was from rectum or vagina for sure.  She notes a fever with sore throat and nasal congestion last evening which is improved today.  She notes she has had light cramping all along, with no worsening since prior evaluations. The cramping is in lower abdomen, 2/10.  She reports nausea and headache, denies vomiting, weakness, vision changes, dysuria.   OB History    Gravida Para Term Preterm AB TAB SAB Ectopic Multiple Living   2 0 0 0 1 0 0 1 0 0       Past Medical History  Diagnosis Date  . Anxiety     Past Surgical History  Procedure Laterality Date  . Appendectomy    . Fracture surgery    . Tonsillectomy    . Ectopic pregnancy surgery      History reviewed. No pertinent family history.  History  Substance Use Topics  . Smoking status: Former Games developermoker  . Smokeless tobacco: Not on file  . Alcohol Use: No    Allergies:  Allergies  Allergen Reactions  . Latex Swelling    Prescriptions prior to admission  Medication Sig Dispense Refill Last Dose  . Prenatal Vit-Fe Fumarate-FA (PRENATAL MULTIVITAMIN) TABS tablet Take 1 tablet by mouth daily at 12 noon.   11/08/2014 at Unknown time    ROS Pertinent ROS in HPI.  All other systems are negative.   Physical Exam   Blood pressure 118/80, pulse 90, temperature 98.3 F (36.8 C), temperature source Oral, resp. rate 18, height 5\' 5"  (1.651 m), weight 177 lb 9.6 oz (80.559 kg), last menstrual period 09/13/2014.  Physical Exam  Constitutional: She is oriented to person, place, and time.  She appears well-developed and well-nourished. No distress.  HENT:  Head: Normocephalic and atraumatic.  Eyes: EOM are normal.  Neck: Normal range of motion.  Cardiovascular: Normal rate, regular rhythm and normal heart sounds.   Respiratory: Effort normal and breath sounds normal. No respiratory distress.  GI: Soft. Bowel sounds are normal. She exhibits no distension and no mass. There is no tenderness. There is no rebound and no guarding.  Genitourinary:  Small amt of dark brown watery discharge present in vagina.   Cervix is closed.  No CMT.  No adnexal mass or tenderness noted.  Musculoskeletal: Normal range of motion.  Neurological: She is alert and oriented to person, place, and time.  Skin: Skin is warm and dry.  Psychiatric: She has a normal mood and affect.   Results for orders placed or performed during the hospital encounter of 11/09/14 (from the past 24 hour(s))  hCG, quantitative, pregnancy     Status: Abnormal   Collection Time: 11/09/14  6:35 PM  Result Value Ref Range   hCG, Beta Chain, Quant, S 3975 (H) <5 mIU/mL     MAU Course  Procedures  MDM HCG ordered.  Did not fully double as today's result is 3975 compared to 2200 two days ago.   U/S done a few days ago without  gest sac seen.  NO increase in pain.  Too early to repeat scan.  Pt understands and is agreeable to this.   Assessment and Plan  A:  1. Vaginal bleeding in pregnancy, first trimester   2. Pregnancy of unknown anatomic location   3. Threatened abortion in early pregnancy     P: Discharge to home Return to MAU in 48 hours for repeat quant Ectopic precautions Keep preexisting appt for u/s on 5/12.  Patient may return to MAU as needed or if her condition were to change or worsen   [name] [name], Ekaterina Denise E 11/09/2014, 6:16 PM

## 2014-11-09 NOTE — Discharge Instructions (Signed)

## 2014-11-09 NOTE — MAU Note (Signed)
Pt reports she has been seen x2 this week for vag pain . Follwed her BHCG on Sunday and TUes they had doubled so was told to come back in 10 days for u/s F/U. Started having some spotting and abd pain today.

## 2014-11-11 ENCOUNTER — Inpatient Hospital Stay (HOSPITAL_COMMUNITY): Payer: 59

## 2014-11-11 ENCOUNTER — Inpatient Hospital Stay (HOSPITAL_COMMUNITY)
Admission: AD | Admit: 2014-11-11 | Discharge: 2014-11-11 | Disposition: A | Discharge status: Home / Self Care | Payer: 59 | Source: Ambulatory Visit | Attending: Obstetrics and Gynecology | Admitting: Obstetrics and Gynecology

## 2014-11-11 DIAGNOSIS — O039 Complete or unspecified spontaneous abortion without complication: Secondary | ICD-10-CM | POA: Diagnosis not present

## 2014-11-11 DIAGNOSIS — Z3A08 8 weeks gestation of pregnancy: Secondary | ICD-10-CM | POA: Diagnosis not present

## 2014-11-11 DIAGNOSIS — O26851 Spotting complicating pregnancy, first trimester: Secondary | ICD-10-CM

## 2014-11-11 DIAGNOSIS — R109 Unspecified abdominal pain: Secondary | ICD-10-CM | POA: Diagnosis present

## 2014-11-11 LAB — HCG, QUANTITATIVE, PREGNANCY: HCG, BETA CHAIN, QUANT, S: 2991 m[IU]/mL — AB (ref <5)

## 2014-11-11 NOTE — MAU Provider Note (Signed)
Ms. Saunders RevelMonica M Peregoy  is a 24 y.o. G2P0010 at 5983w3d who presents to MAU today for follow-up quant hCG after 48 hours. The patient was originally seen in MAU on 11/05/14 for spotting and abdominal pain. US showed no IUGS at that time and hCG was 681. Patient returned after 48 hours and had appropriate rise in quant hCG at that time. She was scheduled to return 11/16/14 for follow-up US, however she presented to MAU again on 11/09/14 for evaluation of spotting. Quant hCG has continued to rise and US was not performed. Patient was instructed to return to MAU today for quant hCG. Patient denies abdominal pain. She states continued intermittent spotting.   BP 124/72 mmHg  Pulse 81  Temp(Src) 98.1 F (36.7 C) (Oral)  Resp 16  Ht 5\' 5"  (1.651 m)  Wt 176 lb 8 oz (80.06 kg)  BMI 29.37 kg/m2  LMP 09/13/2014 (Exact Date)  CONSTITUTIONAL: Well-developed, well-nourished female in no acute distress.  ENT: External right and left ear normal.  EYES: EOM intact, conjunctivae normal.  MUSCULOSKELETAL: Normal range of motion.  CARDIOVASCULAR: Regular heart rate RESPIRATORY: Normal effort NEUROLOGICAL: Alert and oriented to person, place, and time.  SKIN: Skin is warm and dry. No rash noted. Not diaphoretic. No erythema. No pallor. PSYCH: Normal mood and affect. Normal behavior. Normal judgment and thought content.  MDM Quant hCG has decreased slightly since last visit. SAB is likely, but given low interval of change will get US today to attempt to confirm location of pregnancy Discussed patient including history, labs and US results with Dr. Emelda FearFerguson. He agrees this is most likely SAB in progress and patient can follow-up in [facility] in 2 weeks Reviewed US images independently  A: SAB  P: Discharge home Bleeding precautions discussed [facility]. They will call her with an appointment.  Patient may return to MAU as needed or if her condition were to change or worsen   Marny LowensteinJulie N  Borghild Thaker, PA-C 11/11/2014 5:07 PM

## 2014-11-11 NOTE — Discharge Instructions (Signed)
Incomplete Miscarriage A miscarriage is the sudden loss of an unborn baby (fetus) before the 20th week of pregnancy. In an incomplete miscarriage, parts of the fetus or placenta (afterbirth) remain in the body.  Having a miscarriage can be an emotional experience. Talk with your health care provider about any questions you may have about miscarrying, the grieving process, and your future pregnancy plans. CAUSES   Problems with the fetal chromosomes that make it impossible for the baby to develop normally. Problems with the baby's genes or chromosomes are most often the result of errors that occur by chance as the embryo divides and grows. The problems are not inherited from the parents.  Infection of the cervix or uterus.  Hormone problems.  Problems with the cervix, such as having an incompetent cervix. This is when the tissue in the cervix is not strong enough to hold the pregnancy.  Problems with the uterus, such as an abnormally shaped uterus, uterine fibroids, or congenital abnormalities.  Certain medical conditions.  Smoking, drinking alcohol, or taking illegal drugs.  Trauma. SYMPTOMS   Vaginal bleeding or spotting, with or without cramps or pain.  Pain or cramping in the abdomen or lower back.  Passing fluid, tissue, or blood clots from the vagina. DIAGNOSIS  Your health care provider will perform a physical exam. You may also have an ultrasound to confirm the miscarriage. Blood or urine tests may also be ordered. TREATMENT   Usually, a dilation and curettage (D&C) procedure is performed. During a D&C procedure, the cervix is widened (dilated) and any remaining fetal or placental tissue is gently removed from the uterus.  Antibiotic medicines are prescribed if there is an infection. Other medicines may be given to reduce the size of the uterus (contract) if there is a lot of bleeding.  If you have Rh negative blood and your baby was Rh positive, you will need a Rho (D)  immune globulin shot. This shot will protect any future baby from having Rh blood problems in future pregnancies.  You may be confined to bed rest. This means you should stay in bed and only get up to use the bathroom. HOME CARE INSTRUCTIONS   Rest as directed by your health care provider.  Restrict activity as directed by your health care provider. You may be allowed to continue light activity if curettage was not done but you require further treatment.  Keep track of the number of pads you use each day. Keep track of how soaked (saturated) they are. Record this information.  Do not  use tampons.  Do not douche or have sexual intercourse until approved by your health care provider.  Keep all follow-up appointments for reevaluation and continuing management.  Only take over-the-counter or prescription medicines for pain, fever, or discomfort as directed by your health care provider.  Take antibiotic medicine as directed by your health care provider. Make sure you finish it even if you start to feel better. SEEK IMMEDIATE MEDICAL CARE IF:   You experience severe cramps in your stomach, back, or abdomen.  You have an unexplained temperature (make sure to record these temperatures).  You pass large clots or tissue (save these for your health care provider to inspect).  Your bleeding increases.  You become light-headed, weak, or have fainting episodes. MAKE SURE YOU:   Understand these instructions.  Will watch your condition.  Will get help right away if you are not doing well or get worse. Document Released: 06/23/2005 Document Revised: 11/07/2013 Document Reviewed:   01/20/2013 Exit[employer] Patient Information 2015 [employer]. This information is not intended to replace advice given to you by your health care provider. Make sure you discuss any questions you have with your health care provider.  

## 2014-11-11 NOTE — MAU Note (Signed)
Pt here for repeat BHCG. No pain. Still spotting intermittently.

## 2014-11-16 ENCOUNTER — Ambulatory Visit (HOSPITAL_COMMUNITY): Payer: 59

## 2014-11-17 ENCOUNTER — Ambulatory Visit (HOSPITAL_COMMUNITY): Payer: 59

## 2014-12-01 ENCOUNTER — Encounter: Payer: 59 | Admitting: Medical

## 2014-12-01 ENCOUNTER — Inpatient Hospital Stay (HOSPITAL_COMMUNITY)
Admission: EM | Admit: 2014-12-01 | Discharge: 2014-12-01 | Disposition: A | Discharge status: Home / Self Care | Payer: 59 | Attending: Family Medicine | Admitting: Family Medicine

## 2014-12-01 NOTE — MAU Note (Signed)
Pt was supposed to go to clinic at 1000 today for F/u appt post SAB.  Pt thought she was to come to MAU.  Clinic contacted, pt is past 20 minutes late for her appt so it had to be rescheduled.  Appointment is scheduled for June 6 at 1400.  Pt verbalizes understanding.  Pt instructed to go to clinic instead of MAU.  Pt denies any pain or bleeding today.

## 2014-12-11 ENCOUNTER — Encounter: Payer: 59 | Admitting: Obstetrics and Gynecology

## 2014-12-11 ENCOUNTER — Telehealth: Payer: Self-pay | Admitting: General Practice

## 2014-12-11 ENCOUNTER — Encounter: Payer: Self-pay | Admitting: General Practice

## 2014-12-11 NOTE — Telephone Encounter (Signed)
Patient no showed for appt today. Called patient, no answer- left message stating we are trying to reach you in regards to the appt you missed in our office today. Please contact our front office staff to reschedule this appt as it is important you receive follow up. Will send letter

## 2015-09-10 ENCOUNTER — Encounter (HOSPITAL_COMMUNITY): Payer: Self-pay | Admitting: *Deleted

## 2016-02-05 ENCOUNTER — Emergency Department (HOSPITAL_COMMUNITY)
Admission: EM | Admit: 2016-02-05 | Discharge: 2016-02-05 | Disposition: A | Discharge status: Home / Self Care | Payer: Managed Care, Other (non HMO) | Attending: Emergency Medicine | Admitting: Emergency Medicine

## 2016-02-05 ENCOUNTER — Encounter (HOSPITAL_COMMUNITY): Payer: Self-pay | Admitting: Emergency Medicine

## 2016-02-05 DIAGNOSIS — N342 Other urethritis: Secondary | ICD-10-CM | POA: Insufficient documentation

## 2016-02-05 DIAGNOSIS — A6000 Herpesviral infection of urogenital system, unspecified: Secondary | ICD-10-CM | POA: Insufficient documentation

## 2016-02-05 DIAGNOSIS — R300 Dysuria: Secondary | ICD-10-CM | POA: Insufficient documentation

## 2016-02-05 DIAGNOSIS — Z87891 Personal history of nicotine dependence: Secondary | ICD-10-CM | POA: Diagnosis not present

## 2016-02-05 DIAGNOSIS — Z79899 Other long term (current) drug therapy: Secondary | ICD-10-CM | POA: Diagnosis not present

## 2016-02-05 DIAGNOSIS — N898 Other specified noninflammatory disorders of vagina: Secondary | ICD-10-CM | POA: Diagnosis present

## 2016-02-05 LAB — WET PREP, GENITAL
Clue Cells Wet Prep HPF POC: NONE SEEN
Sperm: NONE SEEN
Trich, Wet Prep: NONE SEEN

## 2016-02-05 LAB — URINALYSIS, ROUTINE W REFLEX MICROSCOPIC
Bilirubin Urine: NEGATIVE
Glucose, UA: NEGATIVE mg/dL
Ketones, ur: NEGATIVE mg/dL
Nitrite: NEGATIVE
Protein, ur: NEGATIVE mg/dL
Specific Gravity, Urine: >1.03 — ABNORMAL HIGH (ref 1.005–1.030)
pH: 6 (ref 5.0–8.0)

## 2016-02-05 LAB — URINE MICROSCOPIC-ADD ON

## 2016-02-05 LAB — POC URINE PREG, ED: Preg Test, Ur: NEGATIVE

## 2016-02-05 MED ORDER — CEFTRIAXONE SODIUM 250 MG IJ SOLR
250.0000 mg | Freq: Once | INTRAMUSCULAR | Status: AC
  Administered 2016-02-05: 250 mg via INTRAMUSCULAR
  Filled 2016-02-05: qty 250

## 2016-02-05 MED ORDER — LIDOCAINE HCL (PF) 1 % IJ SOLN
INTRAMUSCULAR | Status: AC
  Filled 2016-02-05: qty 5

## 2016-02-05 MED ORDER — ACYCLOVIR 400 MG PO TABS: 400.0000 mg | ORAL_TABLET | Freq: Three times a day (TID) | ORAL | 0 refills | Status: DC

## 2016-02-05 MED ORDER — AZITHROMYCIN 250 MG PO TABS
1000.0000 mg | ORAL_TABLET | Freq: Once | ORAL | Status: AC
  Administered 2016-02-05: 1000 mg via ORAL
  Filled 2016-02-05: qty 4

## 2016-02-05 NOTE — ED Provider Notes (Signed)
[facility]-EMERGENCY DEPT Provider Note   CSN: [id number] Arrival date & time: 02/05/16  1533  First Provider Contact:  First MD Initiated Contact with Patient 02/05/16 1608        History   Chief Complaint Chief Complaint  Patient presents with  . Vaginal Itching    HPI [name] [name] is a 25 y.o. female.  HPI   [name] [name] is a 25 y.o. female who presents to the Emergency Department complaining of vaginal itching and burning with urination.  [place]  She also reports having unprotected intercourse last week as well, new partner.  She describes a burning pain associated with urination and has also noticed several "red bumps" to her vagina.  She admits to taking some left over antibiotics for few days since noticing the rash.  She cannot recall the name of the medication.  She denies fever, chills, vomiting or diarrhea and vaginal d/c   Past Medical History:  Diagnosis Date  . Anxiety     There are no active problems to display for this patient.   Past Surgical History:  Procedure Laterality Date  . APPENDECTOMY    . ECTOPIC PREGNANCY SURGERY    . FRACTURE SURGERY    . TONSILLECTOMY      OB History    Gravida Para Term Preterm AB Living   2 0 0 0 1 0   SAB TAB Ectopic Multiple Live Births   0 0 1 0         Home Medications    Prior to Admission medications   Medication Sig Start Date End Date Taking? Authorizing Provider  Prenatal Vit-Fe Fumarate-FA (PRENATAL MULTIVITAMIN) TABS tablet Take 1 tablet by mouth daily at 12 noon.    Historical Provider, MD    Family History History reviewed. No pertinent family history.  Social History Social History  Substance Use Topics  . Smoking status: Former Games developer  . Smokeless tobacco: Never Used  . Alcohol use No     Allergies   Latex   Review of Systems Review of Systems  Constitutional: Negative for activity change, appetite change, chills and fever.    Respiratory: Negative for chest tightness and shortness of breath.   Gastrointestinal: Negative for abdominal pain, nausea and vomiting.  Genitourinary: Positive for dysuria, frequency, genital sores and urgency. Negative for decreased urine volume, difficulty urinating, flank pain, hematuria, vaginal bleeding and vaginal discharge.  Musculoskeletal: Negative for back pain.  Skin: Negative for rash.  Neurological: Negative for dizziness, weakness and numbness.  Hematological: Negative for adenopathy.  Psychiatric/Behavioral: Negative for confusion.  All other systems reviewed and are negative.    Physical Exam Updated Vital Signs BP 137/77 (BP Location: Left Arm)   Pulse 114   Temp 98.5 F (36.9 C) (Oral)   Resp 16   Ht  (1.651 m)   Wt 86.2 kg   SpO2 100%   BMI 31.62 kg/m   Physical Exam  Constitutional: She is oriented to person, place, and time. She appears well-developed and well-nourished. No distress.  HENT:  Head: Normocephalic and atraumatic.  Cardiovascular: Normal rate, regular rhythm and intact distal pulses.   Pulmonary/Chest: Effort normal and breath sounds normal. No respiratory distress. She has no wheezes. She has no rales.  Abdominal: Soft. Normal appearance. She exhibits no distension and no mass. There is no hepatosplenomegaly. There is no tenderness. There is no rigidity, no rebound, no guarding, no CVA tenderness and no  tenderness at McBurney's point.  No CVA tenderness  Genitourinary: Uterus normal. There is rash on the right labia. There is rash on the left labia. Cervix exhibits no motion tenderness. Right adnexum displays no mass and no tenderness. Left adnexum displays no mass and no tenderness. No bleeding in the vagina. No foreign body in the vagina. Vaginal discharge found.  Genitourinary Comments: Milky Fauth vaginal d/c, No CMT, no adnexal masses or tenderness.  Several grouped vesicles to the perineum.    Musculoskeletal: Normal range of  motion. She exhibits no edema.  Neurological: She is alert and oriented to person, place, and time. Coordination normal.  Skin: Skin is warm and dry. No rash noted.  Psychiatric: She has a normal mood and affect.  Nursing note and vitals reviewed.    ED Treatments / Results  Labs (all labs ordered are listed, but only abnormal results are displayed) Labs Reviewed  WET PREP, GENITAL  URINALYSIS, ROUTINE W REFLEX MICROSCOPIC (NOT AT [facility])  RPR  POC URINE PREG, ED  GC/CHLAMYDIA PROBE AMP ([place]) NOT AT [facility]    EKG  EKG Interpretation None       Radiology No results found.  Procedures Procedures (including critical care time)  Medications Ordered in ED Medications - No data to display   Initial Impression / Assessment and Plan / ED Course  I have reviewed the triage vital signs and the nursing notes.  Pertinent labs & imaging results that were available during my care of the patient were reviewed by me and considered in my medical decision making (see chart for details).  Clinical Course    Pt is well appearing, non-toxic.  Vitals stable.  Abd soft, NT.  Doubt PID or TOA.  Pt advised of safe sex practice.  Rash to the external genitals appears c/w HSV.    Will tx with IM rocephin and zithromax, rx for anti-viral.  Return precautions given.  Pt appears stable for d/c  GC, Chlaymdia and HSV cultures pending  Final Clinical Impressions(s) / ED Diagnoses   Final diagnoses:  Urethritis  Herpes genitalia    New Prescriptions New Prescriptions   No medications on file     Rosey Bath 02/07/16 2226    Maia Plan, MD 02/07/16 2239

## 2016-02-05 NOTE — Discharge Instructions (Signed)
Use an OTC yeast medication such as Monistat.  Follow-up with the clinic listed.  Return here for any worsening symptoms

## 2016-02-05 NOTE — ED Triage Notes (Signed)
Pt [place] (01/[place]/16).  Noticed red bumps on genitals and burning with urination the next day.  Pt reports trying Nystatin cream and aloe with no relief.  Pt reports unprotected sexual activity early last week.

## 2016-02-06 LAB — GC/CHLAMYDIA PROBE AMP (~~LOC~~) NOT AT ARMC
Chlamydia: NEGATIVE
Neisseria Gonorrhea: NEGATIVE

## 2016-02-06 LAB — RPR: RPR: NONREACTIVE

## 2016-02-07 LAB — URINE CULTURE

## 2016-02-08 ENCOUNTER — Telehealth (HOSPITAL_BASED_OUTPATIENT_CLINIC_OR_DEPARTMENT_OTHER): Payer: Self-pay

## 2016-02-08 LAB — HSV CULTURE AND TYPING

## 2017-02-27 IMAGING — US US OB TRANSVAGINAL
1 series · 14 of 28 positions shown · non-contrast
Comparison: 11/05/2014

CLINICAL DATA: Pregnant, first trimester spotting ; quantitative
beta HCG 2,991 today, was 3,975 on 11/19/2014

EXAM:
TRANSVAGINAL OB ULTRASOUND
TECHNIQUE: Transvaginal ultrasound was performed for complete evaluation of the
gestation as well as the maternal uterus, adnexal regions, and
pelvic cul-de-sac.

[Series 1: us ob transvaginal · 14 of 46 slices shown]
[im 2/46]
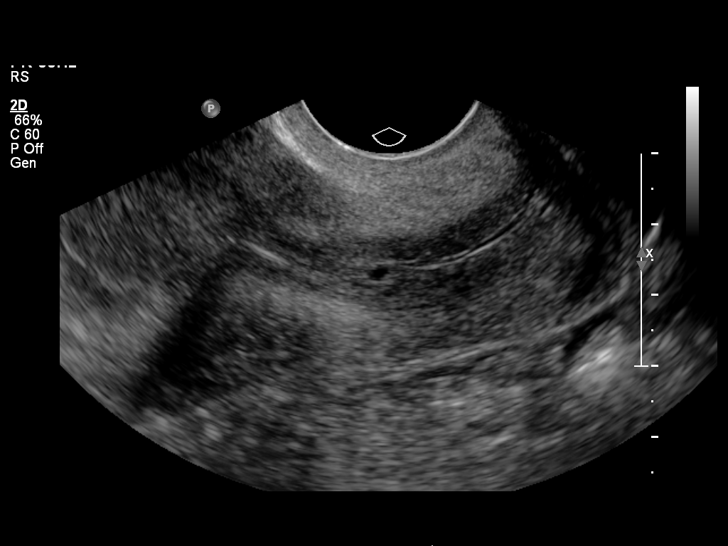
[im 6/46]
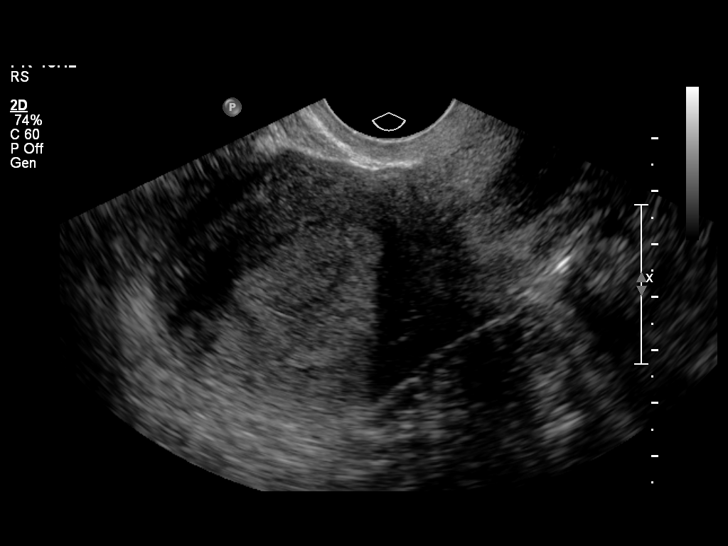
[im 9/46]
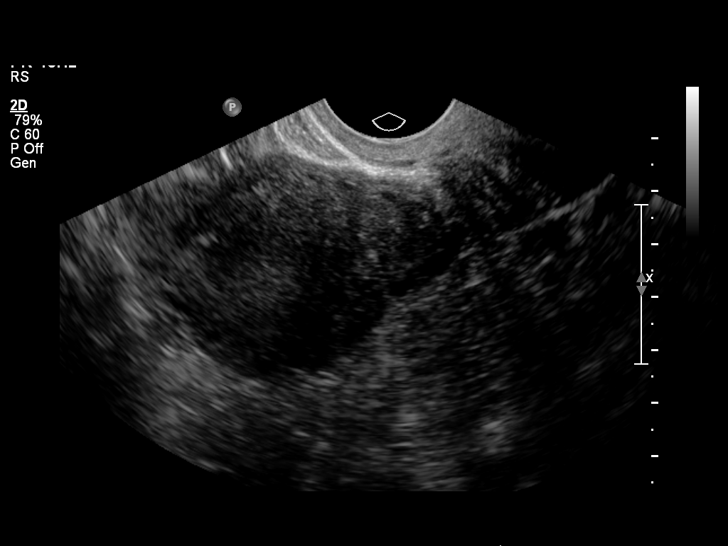
[im 12/46]
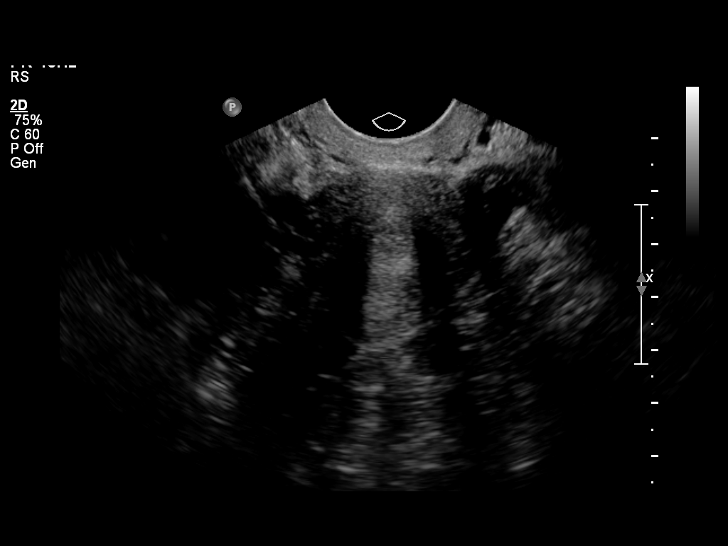
[im 16/46]
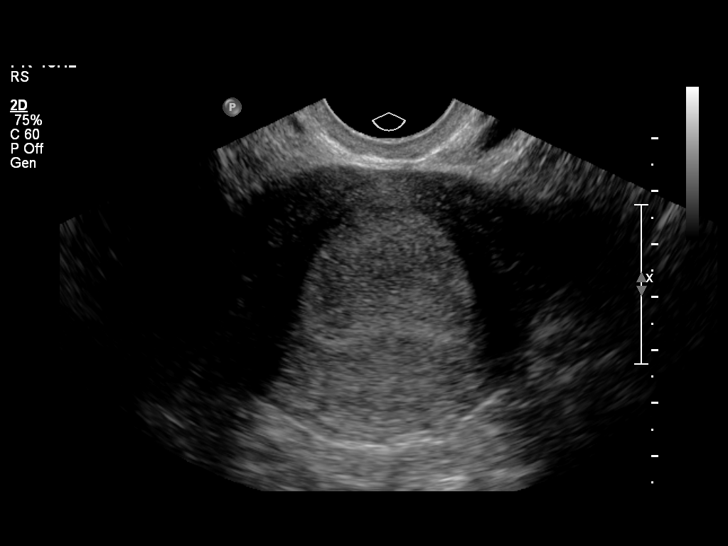
[im 19/46]
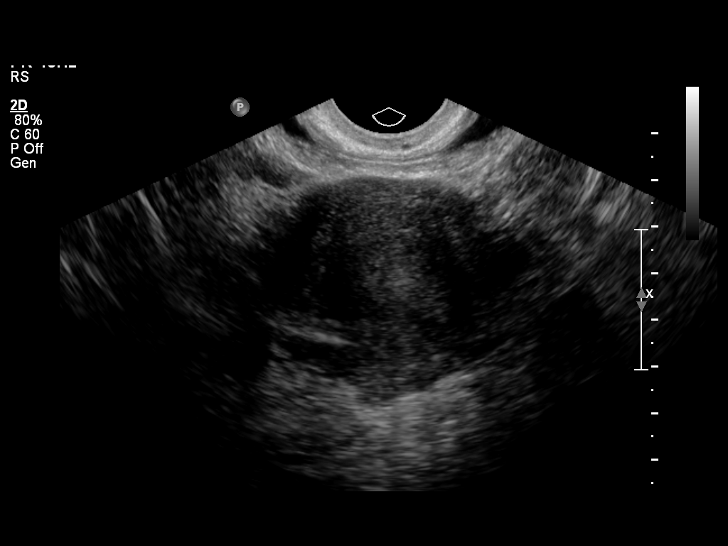
[im 22/46]
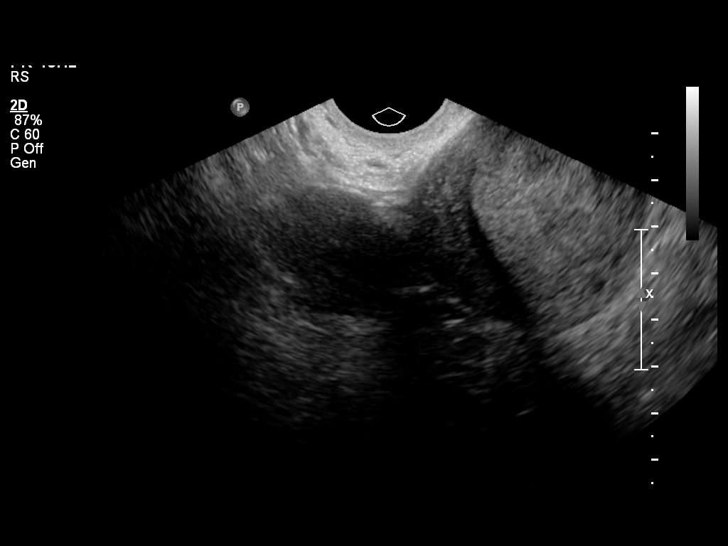
[im 26/46]
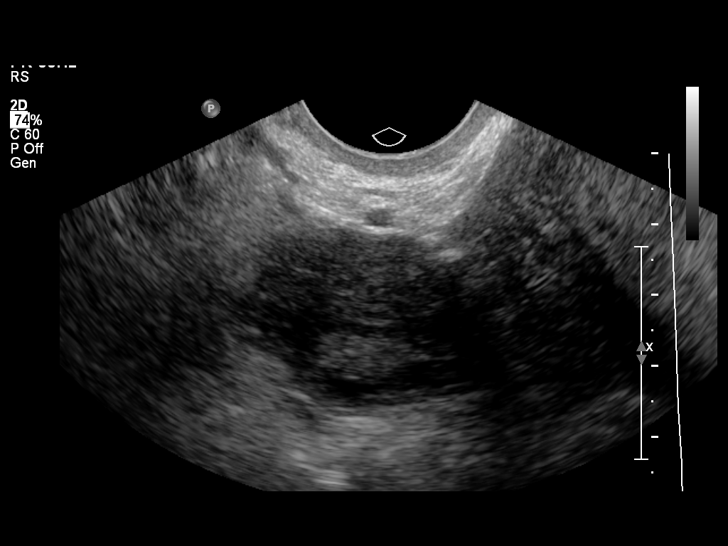
[im 29/46]
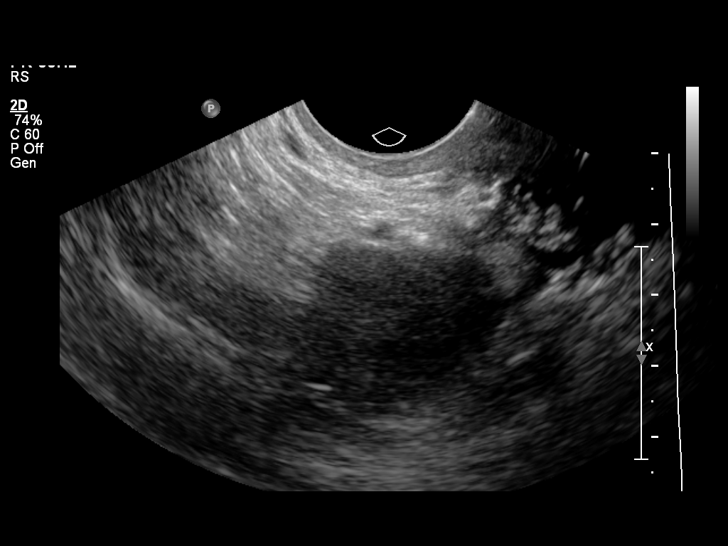
[im 32/46]
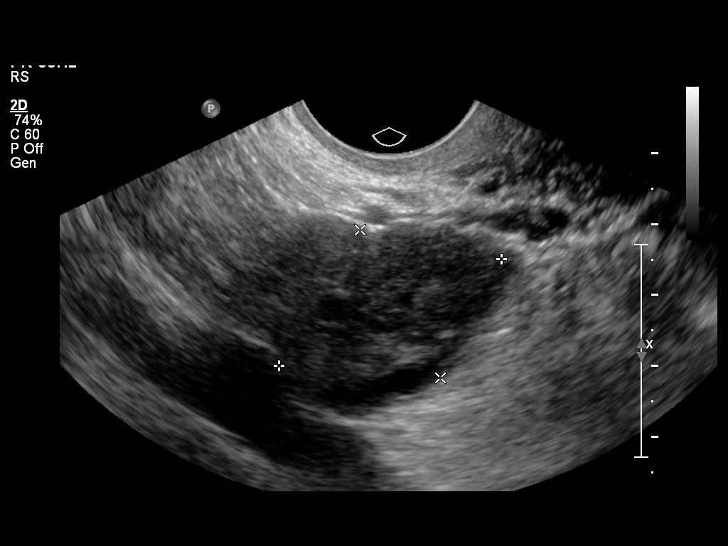
[im 36/46]
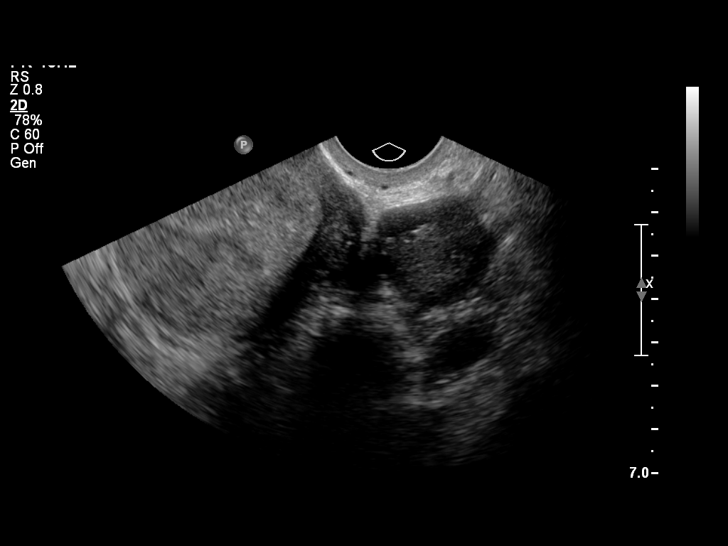
[im 39/46]
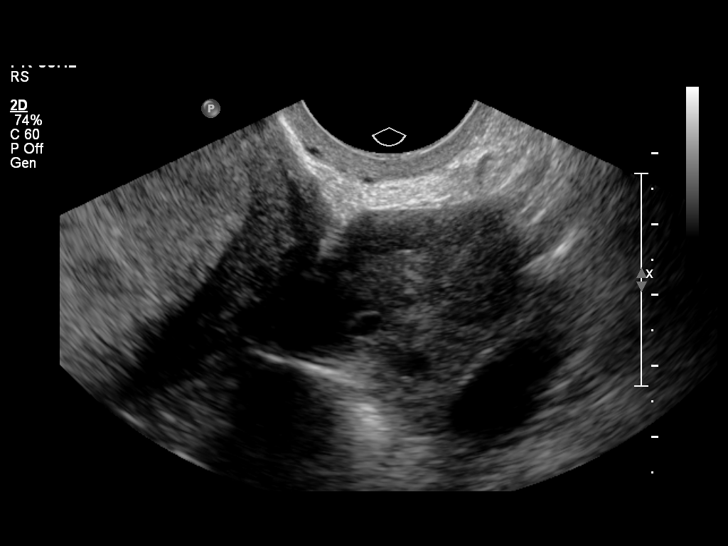
[im 42/46]
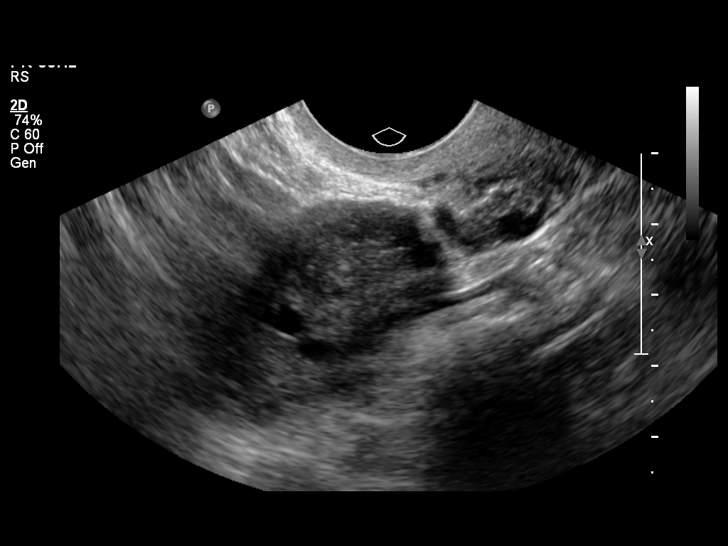
[im 46/46]
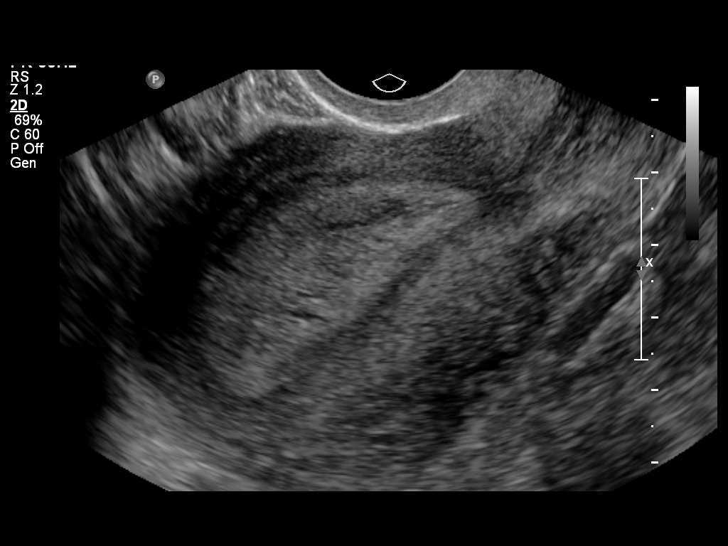

[14 of 28 positions shown; findings below may reference images not displayed]

FINDINGS: Intrauterine gestational sac: Not identified

Yolk sac:  N/A

Embryo:  N/A

Cardiac Activity: N/A

Heart Rate: N/A bpm

Maternal uterus/adnexae: No intrauterine gestational sac identified.

Thickened and heterogeneous endometrial complex 2.6 cm thick.

No uterine mass.

LEFT ovary normal size and morphology 2.9 x 2.4 x 2.8 cm.

RIGHT ovary measures 3.5 x 2.4 x 3.0 cm and contains a small corpus
luteal cyst.

No adnexal masses or free pelvic fluid.
IMPRESSION: No intrauterine gestation identified.

Nonspecific thickening of endometrial complex with otherwise
unremarkable sonographic findings.

While of the fall in quantitative beta HCG since 11/09/2014 [DATE]
reflect spontaneous abortion, continued obstetrical follow-up
recommended to definitively exclude ectopic pregnancy.

## 2018-01-16 ENCOUNTER — Emergency Department (HOSPITAL_BASED_OUTPATIENT_CLINIC_OR_DEPARTMENT_OTHER)
Admission: EM | Admit: 2018-01-16 | Discharge: 2018-01-16 | Disposition: A | Discharge status: Home / Self Care | Payer: Managed Care, Other (non HMO) | Attending: Emergency Medicine | Admitting: Emergency Medicine

## 2018-01-16 ENCOUNTER — Other Ambulatory Visit: Payer: Self-pay

## 2018-01-16 ENCOUNTER — Encounter (HOSPITAL_BASED_OUTPATIENT_CLINIC_OR_DEPARTMENT_OTHER): Payer: Self-pay | Admitting: *Deleted

## 2018-01-16 DIAGNOSIS — N12 Tubulo-interstitial nephritis, not specified as acute or chronic: Secondary | ICD-10-CM

## 2018-01-16 DIAGNOSIS — Z87891 Personal history of nicotine dependence: Secondary | ICD-10-CM | POA: Insufficient documentation

## 2018-01-16 DIAGNOSIS — N200 Calculus of kidney: Secondary | ICD-10-CM | POA: Insufficient documentation

## 2018-01-16 LAB — URINALYSIS, ROUTINE W REFLEX MICROSCOPIC
Glucose, UA: NEGATIVE mg/dL
KETONES UR: 15 mg/dL — AB
LEUKOCYTES UA: NEGATIVE
Nitrite: NEGATIVE
PROTEIN: 30 mg/dL — AB
Specific Gravity, Urine: 1.025 (ref 1.005–1.030)
pH: 6 (ref 5.0–8.0)

## 2018-01-16 LAB — URINALYSIS, MICROSCOPIC (REFLEX)

## 2018-01-16 LAB — PREGNANCY, URINE: PREG TEST UR: NEGATIVE

## 2018-01-16 MED ORDER — CIPROFLOXACIN HCL 500 MG PO TABS
500.0000 mg | ORAL_TABLET | Freq: Once | ORAL | Status: AC
  Administered 2018-01-16: 500 mg via ORAL
  Filled 2018-01-16: qty 1

## 2018-01-16 MED ORDER — FLUCONAZOLE 150 MG PO TABS: 150.0000 mg | ORAL_TABLET | Freq: Every day | ORAL | 0 refills | Status: AC

## 2018-01-16 MED ORDER — CIPROFLOXACIN HCL 500 MG PO TABS: 500.0000 mg | ORAL_TABLET | Freq: Two times a day (BID) | ORAL | 0 refills | Status: AC

## 2018-01-16 NOTE — ED Provider Notes (Signed)
[facility] EMERGENCY DEPARTMENT Provider Note   CSN: [id number] Arrival date & time: 01/16/18  0930     History   Chief Complaint Chief Complaint  Patient presents with  . Flank Pain    HPI [name] [name] is a 27 y.o. female.  HPI   [name] [name] is a 27 year old female with no significant past medical history who presents to the emergency department for evaluation of right flank pain.  Patient reports that she has had urinary frequency, urgency and tactile fevers at home for the past several days.  This morning around 730 she woke up with right-sided nonradiating flank pain.  She reports that pain feels tight and comes and goes without apparent trigger.  Pain is mild at this time, but does become severe.  She also reports associated nausea, no vomiting.  No alleviating factors, no medications for pain prior to arrival.  She denies dysuria, abdominal pain, diarrhea, melena, hematochezia, vaginal discharge, vaginal bleeding, chest pain, shortness of breath, lightheadedness, syncope, cough, congestion, sore throat.  She has had an appendectomy and surgery for tubal pregnancy in 2011.  Her last bowel movement was 4 days ago and normal.  Her last menstrual period was April, she does report irregular menstrual cycles.  Is not on birth control.  Past Medical History:  Diagnosis Date  . Anxiety     There are no active problems to display for this patient.   Past Surgical History:  Procedure Laterality Date  . APPENDECTOMY    . ECTOPIC PREGNANCY SURGERY    . FRACTURE SURGERY    . TONSILLECTOMY       OB History    Gravida  2   Para  0   Term  0   Preterm  0   AB  1   Living  0     SAB  0   TAB  0   Ectopic  1   Multiple  0   Live Births               Home Medications    Prior to Admission medications   Not on File    Family History History reviewed. No pertinent family history.  Social History Social History   Tobacco Use  . Smoking  status: Former Games developer  . Smokeless tobacco: Never Used  Substance Use Topics  . Alcohol use: Yes    Comment: ocassionally  . Drug use: No     Allergies   Latex and Banana   Review of Systems Review of Systems  Constitutional: Positive for chills and fever.  HENT: Negative for congestion and sore throat.   Respiratory: Negative for cough and shortness of breath.   Cardiovascular: Negative for chest pain.  Gastrointestinal: Positive for nausea. Negative for abdominal pain and vomiting.  Genitourinary: Positive for decreased urine volume, flank pain and frequency. Negative for difficulty urinating, dysuria, hematuria, vaginal bleeding and vaginal discharge.  Musculoskeletal: Negative for back pain and gait problem.  Skin: Negative for rash.  Neurological: Negative for syncope and light-headedness.  Psychiatric/Behavioral: Negative for agitation.     Physical Exam Updated Vital Signs BP 126/82 (BP Location: Right Arm)   Pulse 82   Temp 97.7 F (36.5 C) (Oral)   Resp 16   Ht 5\' 5"  (1.651 m)   Wt 87.8 kg (193 lb 9 oz)   LMP 10/26/2017   SpO2 100%   BMI 32.21 kg/m   Physical Exam  Constitutional: She is oriented to person,  place, and time. She appears well-developed and well-nourished. No distress.  No acute distress, nontoxic-appearing.  HENT:  Head: Normocephalic and atraumatic.  Mouth/Throat: Oropharynx is clear and moist.  Mucous memories moist.  Eyes: Pupils are equal, round, and reactive to light. Conjunctivae are normal. Right eye exhibits no discharge. Left eye exhibits no discharge.  Neck: Normal range of motion. Neck supple.  Cardiovascular: Normal rate, regular rhythm and intact distal pulses.  Pulmonary/Chest: Effort normal and breath sounds normal. No stridor. No respiratory distress. She has no wheezes. She has no rales.  Abdominal:  Abdomen soft and nondistended.  Bowel sounds normoactive and all 4 quadrants.  Abdomen nontender to palpation.  CVA  tenderness present on the right.  Musculoskeletal:  No midline T-spine or L-spine tenderness.  Right flank nontender to palpation.  Neurological: She is alert and oriented to person, place, and time. Coordination normal.  Skin: Skin is warm and dry. She is not diaphoretic.  Psychiatric: She has a normal mood and affect. Her behavior is normal.  Nursing note and vitals reviewed.    ED Treatments / Results  Labs (all labs ordered are listed, but only abnormal results are displayed) Labs Reviewed  URINALYSIS, ROUTINE W REFLEX MICROSCOPIC - Abnormal; Notable for the following components:      Result Value   APPearance HAZY (*)    Hgb urine dipstick LARGE (*)    Bilirubin Urine SMALL (*)    Ketones, ur 15 (*)    Protein, ur 30 (*)    All other components within normal limits  URINALYSIS, MICROSCOPIC (REFLEX) - Abnormal; Notable for the following components:   Bacteria, UA MANY (*)    All other components within normal limits  URINE CULTURE  PREGNANCY, URINE    EKG None  Radiology No results found.  Procedures Procedures (including critical care time)  Medications Ordered in ED Medications  ciprofloxacin (CIPRO) tablet 500 mg (500 mg Oral Given 01/16/18 1049)     Initial Impression / Assessment and Plan / ED Course  I have reviewed the triage vital signs and the nursing notes.  Pertinent labs & imaging results that were available during my care of the patient were reviewed by me and considered in my medical decision making (see chart for details).    Patient presents with flank pain, tactile fevers, urinary frequency and urgency. UA infected. Urine pregnancy negative. She is afebrile and VSS. No signs of dehydration. No abdominal tenderness. Symptoms consistent with pyelonephritis. Patient's urine sent for culture. She will be started on ciprofloxacin. Counseled her on NSAID use for pain. Discussed return precautions and she agrees.    Final Clinical Impressions(s) / ED  Diagnoses   Final diagnoses:  Pyelonephritis    ED Discharge Orders        Ordered    ciprofloxacin (CIPRO) 500 MG tablet  Every 12 hours     01/16/18 1052    fluconazole (DIFLUCAN) 150 MG tablet  Daily     01/16/18 1052       Lawrence MarseillesShrosbree, Hilding Quintanar J, PA-C 01/16/18 2012    Rolan BuccoBelfi, Melanie, MD 01/19/18 [phone]

## 2018-01-16 NOTE — ED Notes (Signed)
ED Provider at bedside. 

## 2018-01-16 NOTE — Discharge Instructions (Signed)
You have a urinary tract infection, which has traveled to your kidney.  Please take anabiotic twice a day for 7 days.  Take until all antibiotics are completely gone.  I also written you prescription for Diflucan in case you develop a yeast infection which you can take after the anabiotics are complete.  Please take ibuprofen 600 mg every 6 hours as needed for pain.  He can also take Tylenol every 6 hours.  Return to the ER if you have any new or concerning symptoms like vomiting, abdominal pain, trouble voiding.

## 2018-01-16 NOTE — ED Triage Notes (Signed)
Right flank pain started at 0730 this morning.  Frequent urination, intermittent fever in the past 2 weeks.

## 2018-01-17 LAB — URINE CULTURE: CULTURE: NO GROWTH

## 2018-01-22 ENCOUNTER — Other Ambulatory Visit: Payer: Self-pay

## 2018-01-22 ENCOUNTER — Encounter (HOSPITAL_BASED_OUTPATIENT_CLINIC_OR_DEPARTMENT_OTHER): Payer: Self-pay

## 2018-01-22 ENCOUNTER — Emergency Department (HOSPITAL_BASED_OUTPATIENT_CLINIC_OR_DEPARTMENT_OTHER)
Admission: EM | Admit: 2018-01-22 | Discharge: 2018-01-22 | Disposition: A | Discharge status: Home / Self Care | Payer: Self-pay | Attending: Emergency Medicine | Admitting: Emergency Medicine

## 2018-01-22 ENCOUNTER — Emergency Department (HOSPITAL_BASED_OUTPATIENT_CLINIC_OR_DEPARTMENT_OTHER): Payer: Self-pay

## 2018-01-22 DIAGNOSIS — B9689 Other specified bacterial agents as the cause of diseases classified elsewhere: Secondary | ICD-10-CM | POA: Insufficient documentation

## 2018-01-22 DIAGNOSIS — R109 Unspecified abdominal pain: Secondary | ICD-10-CM | POA: Insufficient documentation

## 2018-01-22 DIAGNOSIS — R9389 Abnormal findings on diagnostic imaging of other specified body structures: Secondary | ICD-10-CM | POA: Insufficient documentation

## 2018-01-22 DIAGNOSIS — N8312 Corpus luteum cyst of left ovary: Secondary | ICD-10-CM | POA: Insufficient documentation

## 2018-01-22 DIAGNOSIS — Z3202 Encounter for pregnancy test, result negative: Secondary | ICD-10-CM | POA: Insufficient documentation

## 2018-01-22 DIAGNOSIS — N83299 Other ovarian cyst, unspecified side: Secondary | ICD-10-CM

## 2018-01-22 DIAGNOSIS — Z87891 Personal history of nicotine dependence: Secondary | ICD-10-CM | POA: Insufficient documentation

## 2018-01-22 DIAGNOSIS — N760 Acute vaginitis: Secondary | ICD-10-CM | POA: Insufficient documentation

## 2018-01-22 LAB — URINALYSIS, COMPLETE (UACMP) WITH MICROSCOPIC
BILIRUBIN URINE: NEGATIVE
Glucose, UA: NEGATIVE mg/dL
Ketones, ur: NEGATIVE mg/dL
LEUKOCYTES UA: NEGATIVE
Nitrite: NEGATIVE
Protein, ur: NEGATIVE mg/dL
pH: 5.5 (ref 5.0–8.0)

## 2018-01-22 LAB — WET PREP, GENITAL
SPERM: NONE SEEN
TRICH WET PREP: NONE SEEN
Yeast Wet Prep HPF POC: NONE SEEN

## 2018-01-22 LAB — PREGNANCY, URINE: PREG TEST UR: NEGATIVE

## 2018-01-22 MED ORDER — METRONIDAZOLE 500 MG PO TABS: 500.0000 mg | ORAL_TABLET | Freq: Two times a day (BID) | ORAL | 0 refills | Status: DC

## 2018-01-22 MED FILL — metroNIDAZOLE 500 MG TABS: 500 | 7 days supply | Qty: 14 | Fill #0

## 2018-01-22 NOTE — ED Triage Notes (Addendum)
Pt states she was seen here 7/13 dx with pyelonephritis and she feels no better-is taking abx-pt is requesting pelvic exam with STI screening-NAD-steady gait

## 2018-01-22 NOTE — Discharge Instructions (Signed)
Please call first thing Monday morning to the [facility] at [facility].  The phone number is listed in your paperwork.  You will need to schedule an appointment to be evaluated for abnormal uterine bleeding, abnormal endometrial thickening, and complex ovarian cyst.   If you need to go to the emergency department please consider going to the emergency department at [facility] which is focused on female care only.  The address is also provided above in the maternity admissions unit column. Return to the emergency department for the following reasons:  You have abdominal pain that is severe or gets worse. You cannot eat or drink without vomiting. You suddenly develop a fever. Your menstrual period is much heavier than usual.

## 2018-01-22 NOTE — ED Notes (Signed)
01/22/2018,Pt. Called and left a message on this RN's voicemail . She has requested results of her urine culture.  Returned pt.s call, Culture results discussed, No Growth.  All questions answered.

## 2018-01-22 NOTE — ED Notes (Signed)
Requests to be checked for STDs although she has no symptoms.  States she was told she needed to be checked by another doctor.  Also reports she believes her UTI has not gotten better.

## 2018-01-22 NOTE — ED Provider Notes (Signed)
[facility] EMERGENCY DEPARTMENT Provider Note   CSN: [id number] Arrival date & time: 01/22/18  1142     History   Chief Complaint Chief Complaint  Patient presents with  . Pyelonephritis    HPI [name] [name] is a 27 y.o. female who presents emergency department chief complaint of pelvic pain and flank pain.  Patient states that she was seen in the emergency department on 01/16/2018.  She was diagnosed with urinary tract infection.  Patient took a round of antibiotics but continues to have pelvic pain and pain that feels like it is radiating into her right flank.  She also wants to have a pelvic examination because she has not had her period in several months and has had abnormal spotting.  She states that her friends told her that it could be asymptomatic chlamydial infection.  Patient denies any other vaginal symptoms or dyspareunia.  She does not have a current OB/GYN follow-up however she was under the care of fertility specialists toward the end of last year.   HPI  Past Medical History:  Diagnosis Date  . Anxiety     There are no active problems to display for this patient.   Past Surgical History:  Procedure Laterality Date  . APPENDECTOMY    . ECTOPIC PREGNANCY SURGERY    . FRACTURE SURGERY Right    Ankle surgery x 2  . TONSILLECTOMY    . WISDOM TOOTH EXTRACTION       OB History    Gravida  2   Para  0   Term  0   Preterm  0   AB  2   Living  0     SAB  1   TAB  0   Ectopic  1   Multiple  0   Live Births  0            Home Medications    Prior to Admission medications   Medication Sig Start Date End Date Taking? Authorizing Provider  ciprofloxacin (CIPRO) 500 MG tablet Take 1 tablet (500 mg total) by mouth every 12 (twelve) hours for 7 days. 01/16/18 01/23/18  Kellie Shropshire, PA-C    Family History No family history on file.  Social History Social History   Tobacco Use  . Smoking status: Former Games developer  . Smokeless  tobacco: Never Used  Substance Use Topics  . Alcohol use: Yes    Comment: occ  . Drug use: No     Allergies   Latex and Banana   Review of Systems Review of Systems  Ten systems reviewed and are negative for acute change, except as noted in the HPI.  Physical Exam Updated Vital Signs BP 111/73 (BP Location: Right Arm)   Pulse 72   Temp 97.8 F (36.6 C) (Oral)   Resp 18   Ht 5\' 5"  (1.651 m)   Wt 85.7 kg (189 lb)   LMP 10/26/2017   SpO2 100%   BMI 31.45 kg/m   Physical Exam  Constitutional: She is oriented to person, place, and time. She appears well-developed and well-nourished. No distress.  HENT:  Head: Normocephalic and atraumatic.  Eyes: Conjunctivae are normal. No scleral icterus.  Neck: Normal range of motion.  Cardiovascular: Normal rate, regular rhythm and normal heart sounds. Exam reveals no gallop and no friction rub.  No murmur heard. Pulmonary/Chest: Effort normal and breath sounds normal. No respiratory distress.  Abdominal: Soft. Bowel sounds are normal. She exhibits no distension and  no mass. There is no tenderness. There is no guarding.  Genitourinary:  Genitourinary Comments: Pelvic exam: normal external genitalia, vulva, vagina, cervix, uterus and adnexa. Oozing blood from the cervical os.  Neurological: She is alert and oriented to person, place, and time.  Skin: Skin is warm and dry. She is not diaphoretic.  Psychiatric: Her behavior is normal.  Nursing note and vitals reviewed.    ED Treatments / Results  Labs (all labs ordered are listed, but only abnormal results are displayed) Labs Reviewed  WET PREP, GENITAL - Abnormal; Notable for the following components:      Result Value   Clue Cells Wet Prep HPF POC PRESENT (*)    WBC, Wet Prep HPF POC MANY (*)    All other components within normal limits  URINALYSIS, COMPLETE (UACMP) WITH MICROSCOPIC - Abnormal; Notable for the following components:   Specific Gravity, Urine >1.030 (*)    Hgb  urine dipstick LARGE (*)    Bacteria, UA MANY (*)    All other components within normal limits  URINE CULTURE  PREGNANCY, URINE  RPR  HIV ANTIBODY (ROUTINE TESTING)  GC/CHLAMYDIA PROBE AMP ([place]) NOT AT [facility]    EKG None  Radiology [place] Transvaginal Non-ob  Result Date: 01/22/2018 CLINICAL DATA:  Pelvic pain. Bleeding. Prior ectopic pregnancy surgery. EXAM: TRANSABDOMINAL AND TRANSVAGINAL ULTRASOUND OF PELVIS DOPPLER ULTRASOUND OF OVARIES TECHNIQUE: Both transabdominal and transvaginal ultrasound examinations of the pelvis were performed. Transabdominal technique was performed for global imaging of the pelvis including uterus, ovaries, adnexal regions, and pelvic cul-de-sac. It was necessary to proceed with endovaginal exam following the transabdominal exam to visualize the uterus and ovaries. Color and duplex Doppler ultrasound was utilized to evaluate blood flow to the ovaries. COMPARISON:  11/11/2014. FINDINGS: Uterus Measurements: 10.3 x 5.3 x 6.0 cm. No fibroids or other mass visualized. Endometrium Thickness: 24.9 mm. Thickened heterogeneous endometrium. Active endometrial pathology cannot be excluded. Right ovary Measurements: 3.8 x 2.3 x 3.7 cm. Normal appearance/no adnexal mass. Goals are noted. Left ovary Measurements: 4.1 x 2.5 x 3.6 cm. Follicles are noted. Complex 1.8 cm cyst most consistent with resolving corpus luteal cyst. Pulsed Doppler evaluation of both ovaries demonstrates normal low-resistance arterial and venous waveforms. Other findings No abnormal free fluid. IMPRESSION: 1. Thickened heterogeneous endometrium. Thickening of 24.9 mm. Active endometrial pathology could present this fashion. Endometrial thickness is considered abnormal. Consider follow-up by US in 6-8 weeks, during the week immediately following menses (exam timing is critical). 2. Complex 1.8 cm cyst left ovary, most likely corpus luteal cyst. Pregnancy test to exclude ectopic pregnancy suggested.  Electronically Signed   By: Maisie Fushomas  Register   On: 01/22/2018 15:43   [place] Pelvis Complete  Result Date: 01/22/2018 CLINICAL DATA:  Pelvic pain. Bleeding. Prior ectopic pregnancy surgery. EXAM: TRANSABDOMINAL AND TRANSVAGINAL ULTRASOUND OF PELVIS DOPPLER ULTRASOUND OF OVARIES TECHNIQUE: Both transabdominal and transvaginal ultrasound examinations of the pelvis were performed. Transabdominal technique was performed for global imaging of the pelvis including uterus, ovaries, adnexal regions, and pelvic cul-de-sac. It was necessary to proceed with endovaginal exam following the transabdominal exam to visualize the uterus and ovaries. Color and duplex Doppler ultrasound was utilized to evaluate blood flow to the ovaries. COMPARISON:  11/11/2014. FINDINGS: Uterus Measurements: 10.3 x 5.3 x 6.0 cm. No fibroids or other mass visualized. Endometrium Thickness: 24.9 mm. Thickened heterogeneous endometrium. Active endometrial pathology cannot be excluded. Right ovary Measurements: 3.8 x 2.3 x 3.7 cm. Normal appearance/no adnexal mass. Goals are noted. Left  ovary Measurements: 4.1 x 2.5 x 3.6 cm. Follicles are noted. Complex 1.8 cm cyst most consistent with resolving corpus luteal cyst. Pulsed Doppler evaluation of both ovaries demonstrates normal low-resistance arterial and venous waveforms. Other findings No abnormal free fluid. IMPRESSION: 1. Thickened heterogeneous endometrium. Thickening of 24.9 mm. Active endometrial pathology could present this fashion. Endometrial thickness is considered abnormal. Consider follow-up by [place] in 6-8 weeks, during the week immediately following menses (exam timing is critical). 2. Complex 1.8 cm cyst left ovary, most likely corpus luteal cyst. Pregnancy test to exclude ectopic pregnancy suggested. Electronically Signed   By: Maisie Fus  Register   On: 01/22/2018 15:43   [place] Art/ven Flow Abd Pelv Doppler  Result Date: 01/22/2018 CLINICAL DATA:  Pelvic pain. Bleeding. Prior ectopic pregnancy  surgery. EXAM: TRANSABDOMINAL AND TRANSVAGINAL ULTRASOUND OF PELVIS DOPPLER ULTRASOUND OF OVARIES TECHNIQUE: Both transabdominal and transvaginal ultrasound examinations of the pelvis were performed. Transabdominal technique was performed for global imaging of the pelvis including uterus, ovaries, adnexal regions, and pelvic cul-de-sac. It was necessary to proceed with endovaginal exam following the transabdominal exam to visualize the uterus and ovaries. Color and duplex Doppler ultrasound was utilized to evaluate blood flow to the ovaries. COMPARISON:  11/11/2014. FINDINGS: Uterus Measurements: 10.3 x 5.3 x 6.0 cm. No fibroids or other mass visualized. Endometrium Thickness: 24.9 mm. Thickened heterogeneous endometrium. Active endometrial pathology cannot be excluded. Right ovary Measurements: 3.8 x 2.3 x 3.7 cm. Normal appearance/no adnexal mass. Goals are noted. Left ovary Measurements: 4.1 x 2.5 x 3.6 cm. Follicles are noted. Complex 1.8 cm cyst most consistent with resolving corpus luteal cyst. Pulsed Doppler evaluation of both ovaries demonstrates normal low-resistance arterial and venous waveforms. Other findings No abnormal free fluid. IMPRESSION: 1. Thickened heterogeneous endometrium. Thickening of 24.9 mm. Active endometrial pathology could present this fashion. Endometrial thickness is considered abnormal. Consider follow-up by [place] in 6-8 weeks, during the week immediately following menses (exam timing is critical). 2. Complex 1.8 cm cyst left ovary, most likely corpus luteal cyst. Pregnancy test to exclude ectopic pregnancy suggested. Electronically Signed   By: Maisie Fus  Register   On: 01/22/2018 15:43    Procedures Procedures (including critical care time)  Medications Ordered in ED Medications - No data to display   Initial Impression / Assessment and Plan / ED Course  I have reviewed the triage vital signs and the nursing notes.  Pertinent labs & imaging results that were available during  my care of the patient were reviewed by me and considered in my medical decision making (see chart for details).     Patient previous urine culture negative for any specific organism.  Patient does have bleeding from her cervical office and I feel like the urine is likely contaminated from her vaginal secretions.  I have sent the urine for culture.  Patient will be treated for BV.  I discussed the findings of complex ovarian cyst and thickened endometrium.  She understands the potential that thickened endometrium could be endometrial cancer and that it is absolutely necessary for her to follow-up with the OB/GYN's and she may need an endometrial biopsy.  The patient was given instructions to follow-up with the women's outpatient clinic at [facility].  Provided with contact information.  She is advised to call first thing Monday morning.  Discussed return precautions with the patient.  She appears appropriate for discharge at this time  Final Clinical Impressions(s) / ED Diagnoses   Final diagnoses:  None    ED Discharge Orders  None       Arthor Captain, PA-C 01/22/18 1635    Gwyneth Sprout, MD 01/23/18 [phone]

## 2018-01-23 LAB — RPR: RPR: NONREACTIVE

## 2018-01-23 LAB — URINE CULTURE: Culture: NO GROWTH

## 2018-01-23 LAB — HIV ANTIBODY (ROUTINE TESTING W REFLEX): HIV SCREEN 4TH GENERATION: NONREACTIVE

## 2018-01-25 LAB — GC/CHLAMYDIA PROBE AMP (~~LOC~~) NOT AT ARMC
Chlamydia: NEGATIVE
NEISSERIA GONORRHEA: NEGATIVE

## 2018-09-09 ENCOUNTER — Emergency Department (HOSPITAL_BASED_OUTPATIENT_CLINIC_OR_DEPARTMENT_OTHER)
Admission: EM | Admit: 2018-09-09 | Discharge: 2018-09-09 | Disposition: A | Discharge status: Home / Self Care | Payer: Medicaid Other | Attending: Emergency Medicine | Admitting: Emergency Medicine

## 2018-09-09 ENCOUNTER — Emergency Department (HOSPITAL_BASED_OUTPATIENT_CLINIC_OR_DEPARTMENT_OTHER): Payer: Medicaid Other

## 2018-09-09 ENCOUNTER — Other Ambulatory Visit: Payer: Self-pay

## 2018-09-09 ENCOUNTER — Encounter (HOSPITAL_BASED_OUTPATIENT_CLINIC_OR_DEPARTMENT_OTHER): Payer: Self-pay | Admitting: Emergency Medicine

## 2018-09-09 DIAGNOSIS — Z87891 Personal history of nicotine dependence: Secondary | ICD-10-CM | POA: Insufficient documentation

## 2018-09-09 DIAGNOSIS — R69 Illness, unspecified: Secondary | ICD-10-CM

## 2018-09-09 DIAGNOSIS — J111 Influenza due to unidentified influenza virus with other respiratory manifestations: Secondary | ICD-10-CM | POA: Insufficient documentation

## 2018-09-09 LAB — PREGNANCY, URINE: PREG TEST UR: NEGATIVE

## 2018-09-09 LAB — GROUP A STREP BY PCR: GROUP A STREP BY PCR: NOT DETECTED

## 2018-09-09 MED ORDER — ACETAMINOPHEN 325 MG PO TABS
650.0000 mg | ORAL_TABLET | Freq: Once | ORAL | Status: AC
  Administered 2018-09-09: 650 mg via ORAL
  Filled 2018-09-09: qty 2

## 2018-09-09 NOTE — ED Triage Notes (Addendum)
Cough and fever x 1 week. Also reports diarrhea x 2 days.

## 2018-09-09 NOTE — ED Provider Notes (Signed)
[facility] EMERGENCY DEPARTMENT Provider Note   CSN: [id number] Arrival date & time: 09/09/18  1342    History   Chief Complaint Chief Complaint  Patient presents with  . Cough  . Diarrhea    HPI [name] [name] is a 28 y.o. female presents today for 1 week of flulike illness.  Patient reports that [name] [name] is an otherwise healthy 28 year old female and 1 week ago [name] [name] developed generalized body aches, rhinorrhea, intermittent fevers/chills, cough without sputum production.  Patient states that symptoms have been constant since onset.  [name] [name] has been using Sudafed and DayQuil with moderate relief of her symptoms.  Patient states that 2 days ago [name] [name] developed nonbloody diarrhea, [name] [name] reports 3-4 episodes in the last 2 days.  Patient reports that 2 days ago [name] [name] also had a fever of 101 F, [name] [name] denies fever since then.  Patient reports that [name] [name] has had multiple family members sick with similar symptoms and with flu diagnoses.    HPI  Past Medical History:  Diagnosis Date  . Anxiety     There are no active problems to display for this patient.   Past Surgical History:  Procedure Laterality Date  . APPENDECTOMY    . ECTOPIC PREGNANCY SURGERY    . FRACTURE SURGERY Right    Ankle surgery x 2  . TONSILLECTOMY    . WISDOM TOOTH EXTRACTION       OB History    Gravida  2   Para  0   Term  0   Preterm  0   AB  2   Living  0     SAB  1   TAB  0   Ectopic  1   Multiple  0   Live Births  0            Home Medications    Prior to Admission medications   Not on File    Family History No family history on file.  Social History Social History   Tobacco Use  . Smoking status: Former Games developermoker  . Smokeless tobacco: Never Used  Substance Use Topics  . Alcohol use: Yes    Comment: occ  . Drug use: No     Allergies   Latex and Banana   Review of Systems Review of Systems  Constitutional: Positive for chills and fever. Negative for appetite  change and diaphoresis.  HENT: Positive for rhinorrhea. Negative for facial swelling, sore throat, trouble swallowing and voice change.   Respiratory: Positive for cough. Negative for shortness of breath.   Cardiovascular: Negative.  Negative for chest pain, palpitations and leg swelling.  Gastrointestinal: Positive for diarrhea (Non-bloody, non-watery). Negative for abdominal pain, nausea and vomiting.  Genitourinary: Negative for dysuria, flank pain, hematuria, vaginal bleeding and vaginal discharge.  Musculoskeletal: Positive for arthralgias and myalgias. Negative for neck pain and neck stiffness.  Skin: Negative.  Negative for rash.  Neurological: Negative.  Negative for dizziness, weakness, numbness and headaches.  All other systems reviewed and are negative.  Physical Exam Updated Vital Signs BP (!) 119/50   Pulse 64   Temp 98.3 F (36.8 C) (Oral)   Resp 18   Ht 5\' 5"  (1.651 m)   Wt 86.2 kg   LMP 08/09/2018   SpO2 100%   BMI 31.62 kg/m   Physical Exam Constitutional:      General: [name] [name] is not in acute distress.    Appearance: Normal appearance. [name] [name] is well-developed. [name] [name] is not ill-appearing or  diaphoretic.  HENT:     Head: Normocephalic and atraumatic.     Jaw: There is normal jaw occlusion. No trismus.     Right Ear: Tympanic membrane, ear canal and external ear normal.     Left Ear: Tympanic membrane, ear canal and external ear normal.     Nose: Rhinorrhea present. Rhinorrhea is clear.     Right Sinus: No maxillary sinus tenderness or frontal sinus tenderness.     Left Sinus: No maxillary sinus tenderness or frontal sinus tenderness.     Mouth/Throat:     Mouth: Mucous membranes are moist.     Pharynx: Oropharynx is clear. Uvula midline.     Comments: The patient has normal phonation and is in control of secretions. No stridor.  Midline uvula without edema. Soft palate rises symmetrically.  Tonsils appear absent.  He tongue protrusion is normal, floor of mouth is soft.  No trismus. No creptius on neck palpation. No gingival erythema or fluctuance noted. Mucus membranes moist. Eyes:     General: Vision grossly intact. Gaze aligned appropriately.     Extraocular Movements: Extraocular movements intact.     Conjunctiva/sclera: Conjunctivae normal.     Pupils: Pupils are equal, round, and reactive to light.  Neck:     Musculoskeletal: Full passive range of motion without pain, normal range of motion and neck supple. No neck rigidity or crepitus.     Trachea: Trachea and phonation normal. No tracheal tenderness or tracheal deviation.     Meningeal: Brudzinski's sign absent.  Cardiovascular:     Rate and Rhythm: Normal rate and regular rhythm.     Pulses: Normal pulses.          Dorsalis pedis pulses are 2+ on the right side and 2+ on the left side.       Posterior tibial pulses are 2+ on the right side and 2+ on the left side.     Heart sounds: Normal heart sounds.  Pulmonary:     Effort: Pulmonary effort is normal. No accessory muscle usage or respiratory distress.     Breath sounds: Normal breath sounds and air entry. No wheezing or rhonchi.  Chest:     Chest wall: No tenderness.  Abdominal:     General: There is no distension.     Palpations: Abdomen is soft.     Tenderness: There is no abdominal tenderness. There is no right CVA tenderness, left CVA tenderness, guarding or rebound. Negative signs include Murphy's sign, Rovsing's sign and McBurney's sign.  Musculoskeletal: Normal range of motion.        General: No tenderness.     Right lower leg: Normal. [name] [name] exhibits no tenderness. No edema.     Left lower leg: Normal. [name] [name] exhibits no tenderness. No edema.  Feet:     Right foot:     Protective Sensation: 3 sites tested. 3 sites sensed.     Left foot:     Protective Sensation: 3 sites tested. 3 sites sensed.  Skin:    General: Skin is warm and dry.     Capillary Refill: Capillary refill takes less than 2 seconds.  Neurological:     Mental Status:  [name] [name] is alert.     GCS: GCS eye subscore is 4. GCS verbal subscore is 5. GCS motor subscore is 6.     Comments: Speech is clear and goal oriented, follows commands Major Cranial nerves without deficit, no facial droop Moves extremities without ataxia, coordination intact Normal gait  Psychiatric:        Behavior: Behavior normal.    ED Treatments / Results  Labs (all labs ordered are listed, but only abnormal results are displayed) Labs Reviewed  GROUP A STREP BY PCR  PREGNANCY, URINE    EKG None  Radiology Dg Chest 2 View  Result Date: 09/09/2018 CLINICAL DATA:  Initial evaluation for acute cough, congestion, fever for 1 week. EXAM: CHEST - 2 VIEW COMPARISON:  None available. FINDINGS: The cardiac and mediastinal silhouettes are within normal limits. The lungs are normally inflated. No airspace consolidation, pleural effusion, or pulmonary edema is identified. There is no pneumothorax. No acute osseous abnormality identified. IMPRESSION: No radiographic evidence for active cardiopulmonary disease. Electronically Signed   By: Rise Mu M.D.   On: 09/09/2018 15:38   Procedures Procedures (including critical care time)  Medications Ordered in ED Medications  acetaminophen (TYLENOL) tablet 650 mg (650 mg Oral Given 09/09/18 1547)    Initial Impression / Assessment and Plan / ED Course  I have reviewed the triage vital signs and the nursing notes.  Pertinent labs & imaging results that were available during my care of the patient were reviewed by me and considered in my medical decision making (see chart for details).    28 year old female without history of immunocompromising diseases presents today for 1 week history of flulike illness.  Patient's symptoms have been constant since onset.  Patient with close contacts who also tested positive for flu.  [name] [name] has intermittent fever, nausea without vomiting, mild nonproductive cough.  [name] [name] is without focal pain, no chest pain or  shortness of breath.  Reports 3-4 episodes of nonbloody/non-watery diarrhea over the past 2 days.  [name] [name] denies nausea, vomiting or abdominal pain. [name] [name] describes generalized body aches.  Physical exam reassuring, lungs clear to auscultation bilaterally.  Vital signs stable.  Abdomen soft, nontender, nondistended without peritoneal signs. - Strep test negative Pregnancy test negative  DG Chest:  FINDINGS: The cardiac and mediastinal silhouettes are within normal limits. The lungs are normally inflated. No airspace consolidation, pleural effusion, or pulmonary edema is identified. There is no pneumothorax. No acute osseous abnormality identified. IMPRESSION: No radiographic evidence for active cardiopulmonary disease.  - Patient with symptoms consistent with influenza.  Vitals are stable, low-grade fever.  No signs of dehydration, tolerating PO's.  Lungs are clear. Discussed the cost versus benefit of Tamiflu treatment with the patient.  The patient understands that symptoms are greater than the recommended 24-48 hour window of treatment.  Patient will be discharged with instructions to orally hydrate, rest, and use over-the-counter medications such as anti-inflammatories ibuprofen and Aleve for muscle aches and Tylenol for fever.   Patient was given Tylenol here today, reassessed and states improvement of her symptoms.  [name] [name] is ambulatory around the room in no acute distress requesting discharge.  On reexamination of the abdomen is soft, nontender nondistended with no peritoneal signs.  Doubt intra-abdominal bacterial infection as cause of diarrhea, suspect related to flulike viral illness.  At discharge patient is afebrile, not tachycardic, well-appearing and in no acute distress.  At this time there does not appear to be any evidence of an acute emergency medical condition and the patient appears stable for discharge with appropriate outpatient follow up. Diagnosis was discussed with patient who  verbalizes understanding of care plan and is agreeable to discharge. I have discussed return precautions with patient who verbalize understanding of return precautions. Patient encouraged to follow-up with their PCP. All questions answered. Patient has  been discharged in good condition.  Patient's case discussed with Dr. Lockie Mola who agrees with plan to discharge with follow-up.   Note: Portions of this report may have been transcribed using voice recognition software. Every effort was made to ensure accuracy; however, inadvertent computerized transcription errors may still be present. Final Clinical Impressions(s) / ED Diagnoses   Final diagnoses:  Influenza-like illness    ED Discharge Orders    None       Bill Salinas, PA-C 09/09/18 1650    Virgina Norfolk, DO 09/09/18 1655

## 2018-09-09 NOTE — Discharge Instructions (Addendum)
You have been diagnosed today with Influenza-Like Illness.  At this time there does not appear to be the presence of an emergent medical condition, however there is always the potential for conditions to change. Please read and follow the below instructions.  Please return to the Emergency Department immediately for any new or worsening symptoms or if your symptoms do not improve within 3 days. Please be sure to follow up with your Primary Care Provider within one week regarding your visit today; please call their office to schedule an appointment even if you are feeling better for a follow-up visit.  You have been given a referral to [facility] health committee health to establish a primary care provider if you do not have one. Please take Ibuprofen (Advil, motrin) and Tylenol (acetaminophen) to relieve your pain.  You may take up to 400 MG (2 pills) of normal strength ibuprofen every 8 hours as needed.  In between doses of ibuprofen you make take tylenol, up to 500 mg (one extra strength pill).  Do not take more than 3,000 mg tylenol in a 24 hour period.  Please check all medication labels as many medications such as pain and cold medications may contain tylenol.  Do not drink alcohol while taking these medications.  Do not take other NSAID'S while taking ibuprofen (such as aleve or naproxen).  Please take ibuprofen with food to decrease stomach upset. You may use over-the-counter Mucinex to help with your cough.  Please drink plenty of water and get plenty of rest to help with your symptoms.  Drinking water is important to avoid dehydration.  Get help right away if you: Have shortness of breath. Have trouble breathing. Have skin or nails that turn a bluish color. Have very bad pain or stiffness in your neck. Get a sudden headache. Get sudden pain in your face or ear. Cannot eat or drink without throwing up. Get help right away if: You have trouble breathing. You have a severe headache or a stiff  neck. You have severe vomiting or abdominal pain. Get help right away if: You have symptoms of very bad dehydration. You cannot drink fluids without throwing up (vomiting). Your symptoms get worse with treatment. You have a fever. You have a very bad headache. You are throwing up or having watery poop (diarrhea) and it: Gets worse. Does not go away. You have blood or something green (bile) in your throw-up. You have blood in your poop (stool). This may cause poop to look black and tarry. You have not peed in 6-8 hours. You pass out (faint). Your heart rate when you are sitting still is more than 100 beats a minute. You have trouble breathing.  Please read the additional information packets attached to your discharge summary.  Do not take your medicine if  develop an itchy rash, swelling in your mouth or lips, or difficulty breathing.

## 2018-10-09 IMAGING — US US TRANSVAGINAL NON-OB
2 series · 13 of 25 positions shown · non-contrast
Comparison: 11/11/2014.

CLINICAL DATA: Pelvic pain. Bleeding. Prior ectopic pregnancy
surgery.

EXAM:
TRANSABDOMINAL AND TRANSVAGINAL ULTRASOUND OF PELVIS
DOPPLER ULTRASOUND OF OVARIES
TECHNIQUE: Both transabdominal and transvaginal ultrasound examinations of the
pelvis were performed. Transabdominal technique was performed for
global imaging of the pelvis including uterus, ovaries, adnexal
regions, and pelvic cul-de-sac.
It was necessary to proceed with endovaginal exam following the
transabdominal exam to visualize the uterus and ovaries. Color and
duplex Doppler ultrasound was utilized to evaluate blood flow to the
ovaries.

[Series 1: us transvaginal non-ob · 0.20mm/px · 12 of 110 slices shown (1 of 2)]
[im 1/110]
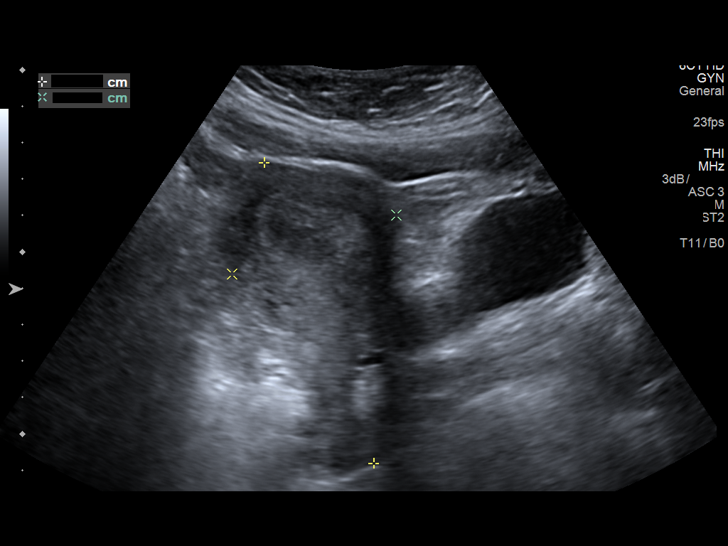
[im 10/110]
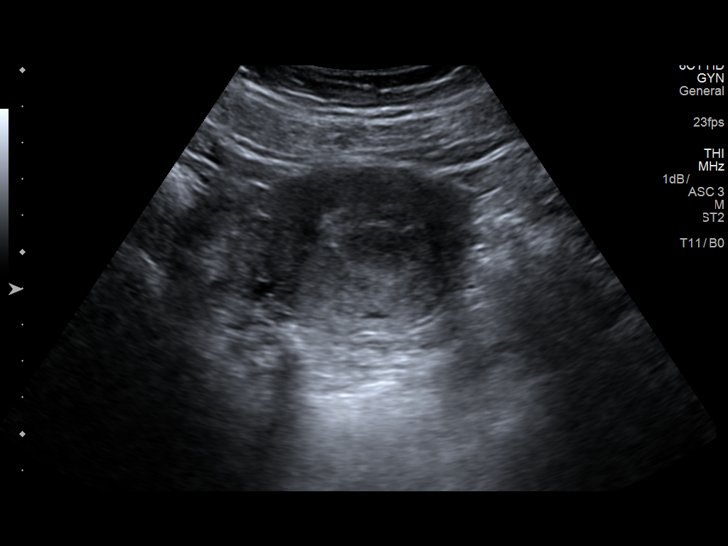
[im 19/110]
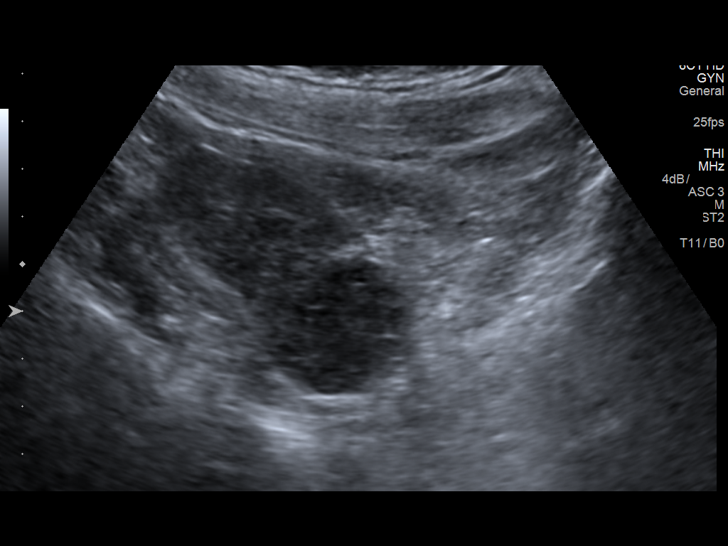
[im 29/110]
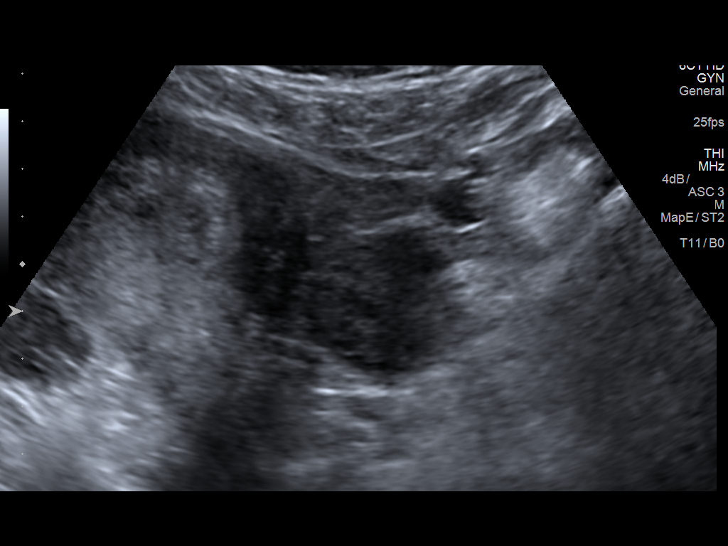
[im 38/110]
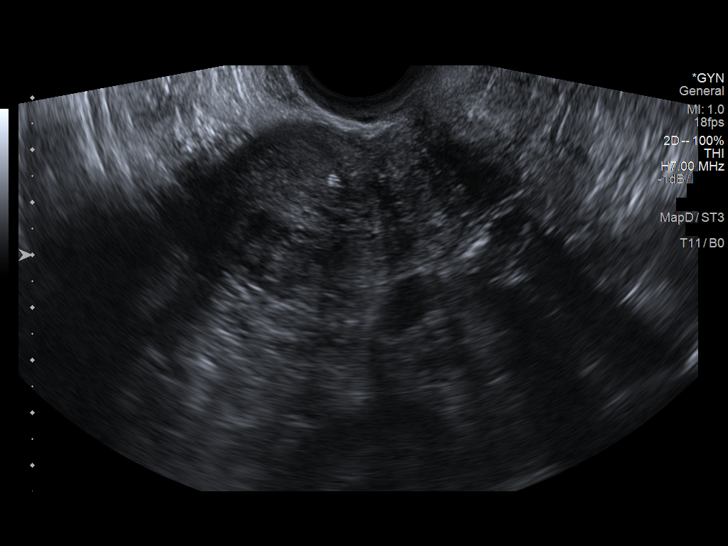
[im 48/110]
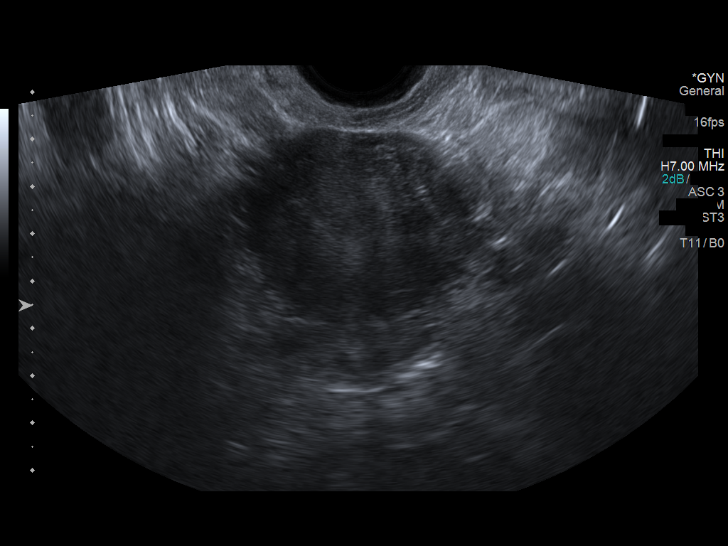
[im 57/110]
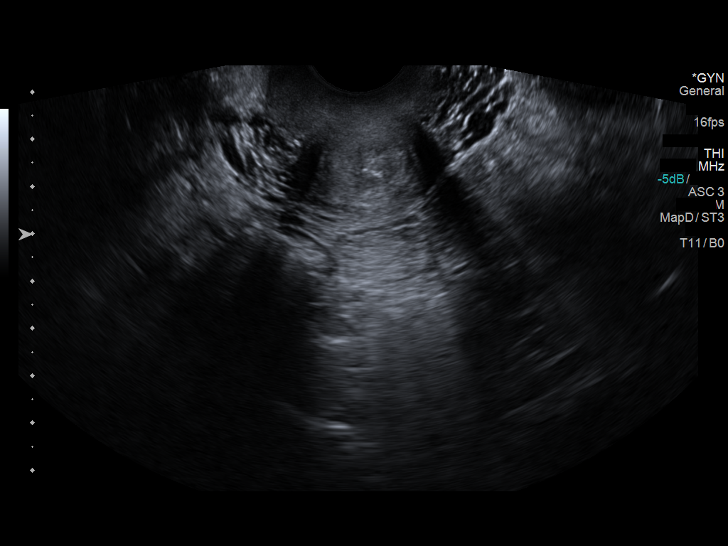
[im 67/110]
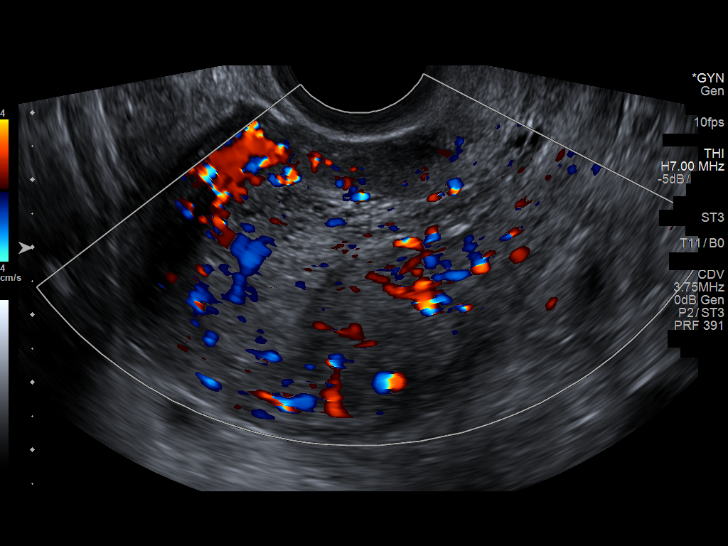
[im 76/110]
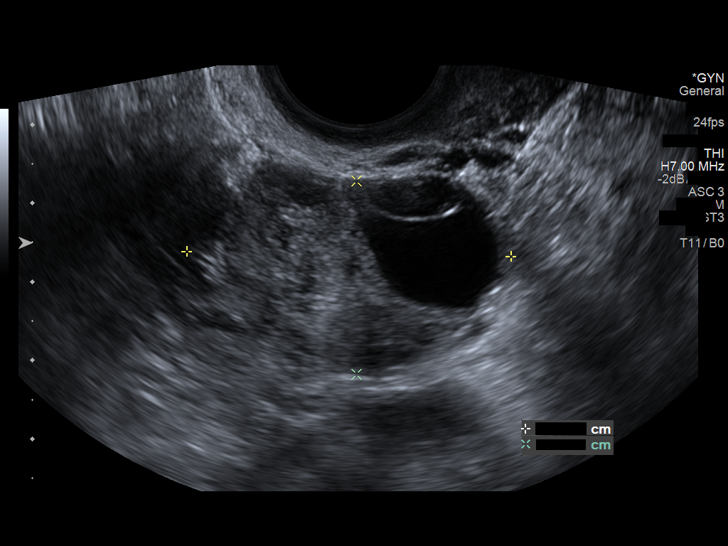
[im 86/110]
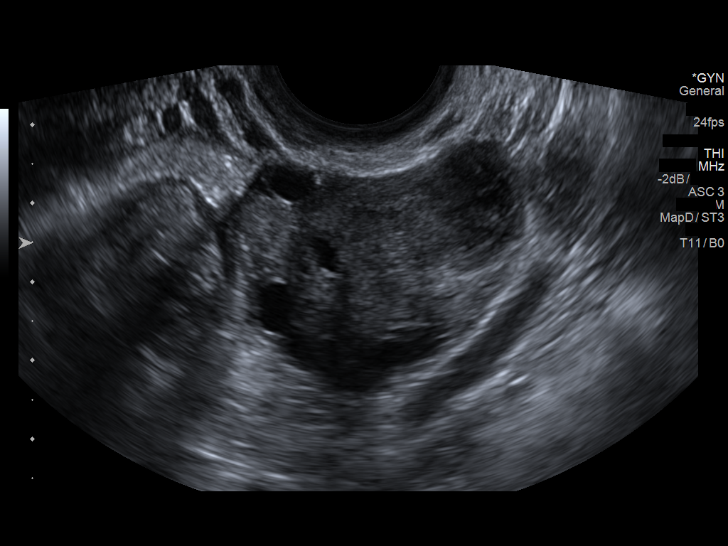
[im 95/110]
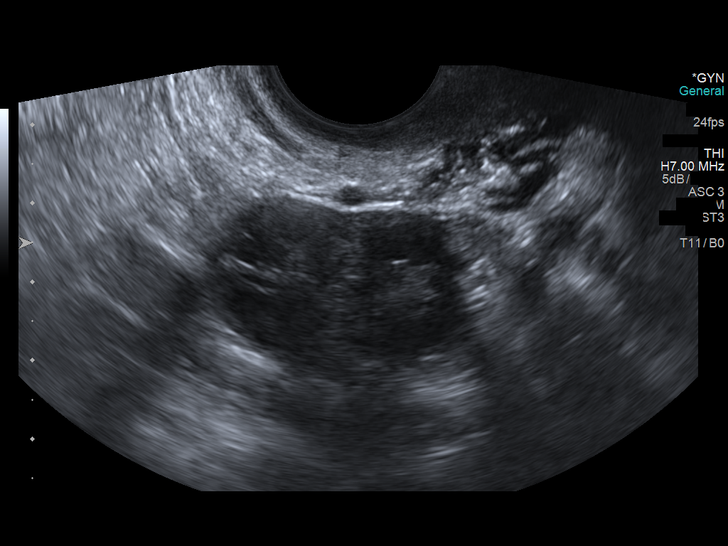
[im 105/110]
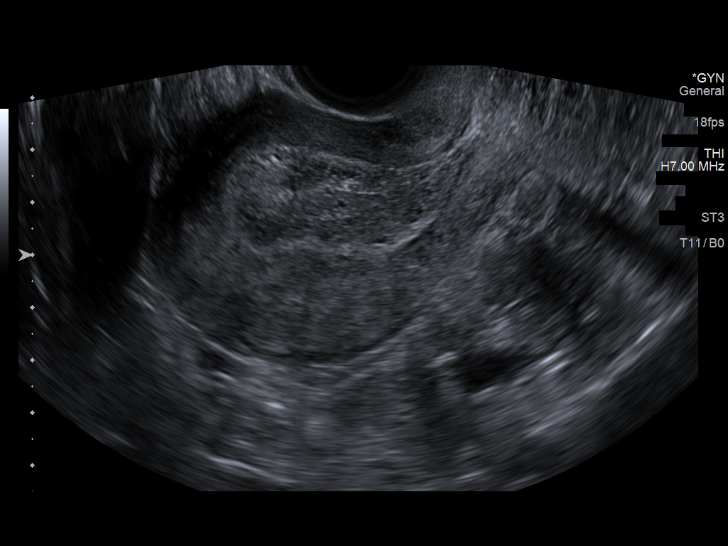

[Series 2: us transvaginal non-ob · 0.14mm/px · 1 of 1 slices shown (2 of 2)]
[im 1/1]
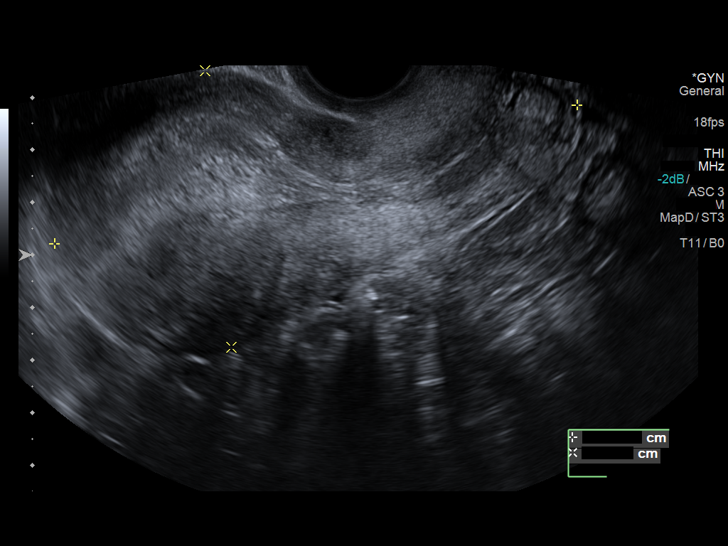

[13 of 25 positions shown; findings below may reference images not displayed]

FINDINGS: Uterus

Measurements: 10.3 x 5.3 x 6.0 cm. No fibroids or other mass
visualized.

Endometrium

Thickness: 24.9 mm. Thickened heterogeneous endometrium. Active
endometrial pathology cannot be excluded.

Right ovary

Measurements: 3.8 x 2.3 x 3.7 cm. Normal appearance/no adnexal mass.
Goals are noted.

Left ovary

Measurements: 4.1 x 2.5 x 3.6 cm. Follicles are noted. Complex
cm cyst most consistent with resolving corpus luteal cyst.

Pulsed Doppler evaluation of both ovaries demonstrates normal
low-resistance arterial and venous waveforms.

Other findings

No abnormal free fluid.
IMPRESSION: 1. Thickened heterogeneous endometrium. Thickening of 24.9 mm.
Active endometrial pathology could present this fashion. Endometrial
thickness is considered abnormal. Consider follow-up by US in 6-8
weeks, during the week immediately following menses (exam timing is
critical).

2. Complex 1.8 cm cyst left ovary, most likely corpus luteal cyst.
Pregnancy test to exclude ectopic pregnancy suggested.
